# Patient Record
Sex: Female | Born: 1950
Health system: Southern US, Community
[De-identification: ages and names within clinical notes are randomized; demographics above are authoritative.]

## PROBLEM LIST (undated history)

## (undated) DIAGNOSIS — M51369 Other intervertebral disc degeneration, lumbar region without mention of lumbar back pain or lower extremity pain: Secondary | ICD-10-CM

## (undated) DIAGNOSIS — J449 Chronic obstructive pulmonary disease, unspecified: Secondary | ICD-10-CM

## (undated) DIAGNOSIS — I10 Essential (primary) hypertension: Secondary | ICD-10-CM

## (undated) DIAGNOSIS — M109 Gout, unspecified: Secondary | ICD-10-CM

## (undated) DIAGNOSIS — I739 Peripheral vascular disease, unspecified: Secondary | ICD-10-CM

## (undated) DIAGNOSIS — H25019 Cortical age-related cataract, unspecified eye: Secondary | ICD-10-CM

## (undated) DIAGNOSIS — J189 Pneumonia, unspecified organism: Secondary | ICD-10-CM

## (undated) DIAGNOSIS — M5136 Other intervertebral disc degeneration, lumbar region: Secondary | ICD-10-CM

## (undated) DIAGNOSIS — J45909 Unspecified asthma, uncomplicated: Secondary | ICD-10-CM

## (undated) DIAGNOSIS — E785 Hyperlipidemia, unspecified: Secondary | ICD-10-CM

## (undated) DIAGNOSIS — L309 Dermatitis, unspecified: Secondary | ICD-10-CM

## (undated) DIAGNOSIS — M1990 Unspecified osteoarthritis, unspecified site: Secondary | ICD-10-CM

## (undated) HISTORY — DX: Other intervertebral disc degeneration, lumbar region: M51.36

---
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
## 2005-03-19 ENCOUNTER — Emergency Department: Payer: Self-pay | Admitting: Emergency Medicine

## 2011-09-01 ENCOUNTER — Emergency Department: Payer: Self-pay | Admitting: Emergency Medicine

## 2011-12-05 ENCOUNTER — Emergency Department: Payer: Self-pay | Admitting: Emergency Medicine

## 2013-04-30 ENCOUNTER — Ambulatory Visit: Payer: Self-pay | Admitting: Family Medicine

## 2013-09-19 ENCOUNTER — Ambulatory Visit: Payer: Self-pay | Admitting: Physical Medicine and Rehabilitation

## 2014-06-07 ENCOUNTER — Other Ambulatory Visit: Payer: Self-pay | Admitting: Neurosurgery

## 2014-06-07 DIAGNOSIS — M4310 Spondylolisthesis, site unspecified: Secondary | ICD-10-CM

## 2014-06-10 ENCOUNTER — Ambulatory Visit
Admission: RE | Admit: 2014-06-10 | Discharge: 2014-06-10 | Disposition: A | Discharge status: Home / Self Care | Payer: No Typology Code available for payment source | Source: Ambulatory Visit | Attending: Neurosurgery | Admitting: Neurosurgery

## 2014-06-10 VITALS — BP 108/59 | HR 71

## 2014-06-10 DIAGNOSIS — M4310 Spondylolisthesis, site unspecified: Secondary | ICD-10-CM

## 2014-06-10 MED ORDER — ONDANSETRON HCL 4 MG/2ML IJ SOLN
4.0000 mg | Freq: Once | INTRAMUSCULAR | Status: AC
  Administered 2014-06-10: 4 mg via INTRAMUSCULAR

## 2014-06-10 MED ORDER — MEPERIDINE HCL 100 MG/ML IJ SOLN
100.0000 mg | Freq: Once | INTRAMUSCULAR | Status: AC
  Administered 2014-06-10: 100 mg via INTRAMUSCULAR

## 2014-06-10 MED ORDER — DIAZEPAM 5 MG PO TABS
10.0000 mg | ORAL_TABLET | Freq: Once | ORAL | Status: AC
  Administered 2014-06-10: 10 mg via ORAL

## 2014-06-10 MED ORDER — IOHEXOL 180 MG/ML  SOLN: 20.0000 mL | Freq: Once | INTRAMUSCULAR | Status: DC | PRN

## 2014-06-10 NOTE — Progress Notes (Signed)
Patient states she has been off Cymbalta for at least the past two days.  jkl 

## 2014-06-10 NOTE — Discharge Instructions (Signed)
Myelogram Discharge Instructions  1. Go home and rest quietly for the next 24 hours.  It is important to lie flat for the next 24 hours.  Get up only to go to the restroom.  You may lie in the bed or on a couch on your back, your stomach, your left side or your right side.  You may have one pillow under your head.  You may have pillows between your knees while you are on your side or under your knees while you are on your back.  2. DO NOT drive today.  Recline the seat as far back as it will go, while still wearing your seat belt, on the way home.  3. You may get up to go to the bathroom as needed.  You may sit up for 10 minutes to eat.  You may resume your normal diet and medications unless otherwise indicated.  Drink plenty of extra fluids today and tomorrow.  4. The incidence of a spinal headache with nausea and/or vomiting is about 5% (one in 20 patients).  If you develop a headache, lie flat and drink plenty of fluids until the headache goes away.  Caffeinated beverages may be helpful.  If you develop severe nausea and vomiting or a headache that does not go away with flat bed rest, call [phone].  5. You may resume normal activities after your 24 hours of bed rest is over; however, do not exert yourself strongly or do any heavy lifting tomorrow.  6. Call your physician for a follow-up appointment.   You may resume Cymbalta on Friday, June 11, 2014 after 11:00a.m.

## 2014-08-03 DIAGNOSIS — M5416 Radiculopathy, lumbar region: Secondary | ICD-10-CM | POA: Insufficient documentation

## 2014-08-03 DIAGNOSIS — M5136 Other intervertebral disc degeneration, lumbar region: Secondary | ICD-10-CM | POA: Insufficient documentation

## 2014-11-15 DIAGNOSIS — M7542 Impingement syndrome of left shoulder: Secondary | ICD-10-CM | POA: Insufficient documentation

## 2015-01-22 ENCOUNTER — Emergency Department: Payer: Self-pay | Admitting: Emergency Medicine

## 2017-03-19 ENCOUNTER — Ambulatory Visit: Payer: Self-pay | Admitting: Podiatry

## 2017-06-14 ENCOUNTER — Ambulatory Visit (INDEPENDENT_AMBULATORY_CARE_PROVIDER_SITE_OTHER): Payer: Medicaid Other | Admitting: Podiatry

## 2017-06-14 DIAGNOSIS — M79676 Pain in unspecified toe(s): Secondary | ICD-10-CM

## 2017-06-14 DIAGNOSIS — L603 Nail dystrophy: Secondary | ICD-10-CM

## 2017-06-14 DIAGNOSIS — Q828 Other specified congenital malformations of skin: Secondary | ICD-10-CM | POA: Diagnosis not present

## 2017-06-14 DIAGNOSIS — B351 Tinea unguium: Secondary | ICD-10-CM

## 2017-06-14 NOTE — Patient Instructions (Signed)

## 2017-06-14 NOTE — Progress Notes (Signed)
   Subjective:    Patient ID: Sarah CouchMary Buck, female    DOB: September 14, 1951, 66 y.o.   MRN: [id number]  HPI    Review of Systems  HENT: Positive for sinus pain.   Eyes: Positive for itching.  Respiratory: Positive for cough and wheezing.   Musculoskeletal: Positive for back pain and myalgias.  Allergic/Immunologic: Negative.   Neurological: Positive for headaches.  Hematological: Negative.   Psychiatric/Behavioral: Negative.   All other systems reviewed and are negative.      Objective:   Physical Exam        Assessment & Plan:

## 2017-06-14 NOTE — Progress Notes (Signed)
Patient ID: Sarah Buck, female   DOB: July 02, 1951, 66 y.o.   MRN: [id number]   SUBJECTIVE Patient  presents to office today complaining of elongated, thickened nails. Pain while ambulating in shoes. Patient is unable to trim their own nails. Patient also complains that the right great toenails extremely painful due to the thickness and elongation of the nail. She would like to nail totally removed. She states this been ongoing for several years. 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 Patient also complains of symptomatic painful callus lesions on the bilateral forefeet for several years. Patient states that they're extremely painful walk on.  OBJECTIVE General Patient is awake, alert, and oriented x 3 and in no acute distress. Derm Skin is dry and supple bilateral with exception of hyperkeratotic callus lesions noted to the bilateral forefoot 5. Negative open lesions or macerations. Remaining integument unremarkable. Nails are tender, long, thickened and dystrophic with subungual debris, consistent with onychomycosis, 1-5 bilateral. No signs of infection noted. Extremely thickened and discolored toenail noted to the right great toe which is very sensitive and painful to palpation. Vasc  DP and PT pedal pulses palpable bilaterally. Temperature gradient within normal limits.  Neuro Epicritic and protective threshold sensation diminished bilaterally.  Musculoskeletal Exam No symptomatic pedal deformities noted bilateral. Muscular strength within normal limits.  ASSESSMENT 1. Onychodystrophic nails 1-5 bilateral with hyperkeratosis of nails.  2. Painful dystrophic nail right great toe 3. Porokeratosis bilateral feet 5  PLAN OF CARE 1. Patient evaluated today.  2. Instructed to maintain good pedal hygiene and foot care.  3. Mechanical debridement of nails 1-5 bilaterally performed using a nail nipper. Filed with dremel without incident.  4. Today the decision was made to  perform total permanent nail avulsion to the right great toe. All details the procedure and possible complications were explained. Prior to total nail avulsion procedure local anesthesia infiltration was utilized the hallux block fashion right great toe. Total permanent nail avulsion procedure was then performed in an aseptic manner followed by 330 seconds applications of phenol and alcohol flush. 5. Sterile dressings were applied 6. Excisional debridement of the porokeratosis lesions was performed to the bilateral feet 5 using a chisel blade without incident or bleeding.  7. Recommend over-the-counter urea 40% cream 8. Return to clinic in 2 weeks    Felecia ShellingBrent M. Evans, DPM [facility]  Dr. Felecia ShellingBrent M. Evans, DPM    [address]                                        [place], [place] [address]                Office [phone]  Fax [phone]

## 2017-07-09 ENCOUNTER — Ambulatory Visit (INDEPENDENT_AMBULATORY_CARE_PROVIDER_SITE_OTHER): Payer: Medicaid Other | Admitting: Podiatry

## 2017-07-09 DIAGNOSIS — L603 Nail dystrophy: Secondary | ICD-10-CM

## 2017-07-09 DIAGNOSIS — M79676 Pain in unspecified toe(s): Secondary | ICD-10-CM

## 2017-07-09 DIAGNOSIS — B351 Tinea unguium: Secondary | ICD-10-CM | POA: Diagnosis not present

## 2017-07-17 NOTE — Progress Notes (Signed)
   Subjective: Patient presents today 2 weeks post total nail avulsion procedure. Patient states that the toe and nail fold is feeling much better.  No past medical history on file.  Objective: Skin is warm, dry and supple. Nail bed and respective nail fold appears to be healing appropriately. Open wound to the associated nail fold with a granular wound base and moderate amount of fibrotic tissue. Minimal drainage noted. Mild erythema around the periungual region likely due to phenol chemical matricectomy.  Assessment: #1 postop permanent total nail avulsion right great toe #2 open wound periungual nail fold and nail bed of respective digit.   Plan of care: #1 patient was evaluated  #2 debridement of open wound was performed to the periungual border and nail fold of the respective toe using a currette. Antibiotic ointment and Band-Aid was applied. #3 patient is to return to clinic on a PRN  basis.   Felecia ShellingBrent M. Sarae Nicholes, DPM [facility]  Dr. Felecia ShellingBrent M. Denver Bentson, DPM    [address]                                        [place], [place] [address]                Office [phone](336) [phone]  Fax [phone]

## 2017-10-11 ENCOUNTER — Ambulatory Visit: Payer: Medicaid Other | Admitting: Podiatry

## 2017-10-11 ENCOUNTER — Ambulatory Visit (INDEPENDENT_AMBULATORY_CARE_PROVIDER_SITE_OTHER): Payer: Medicaid Other | Admitting: Podiatry

## 2017-10-11 DIAGNOSIS — L989 Disorder of the skin and subcutaneous tissue, unspecified: Secondary | ICD-10-CM

## 2017-10-11 DIAGNOSIS — M79676 Pain in unspecified toe(s): Secondary | ICD-10-CM | POA: Diagnosis not present

## 2017-10-11 DIAGNOSIS — B351 Tinea unguium: Secondary | ICD-10-CM | POA: Diagnosis not present

## 2017-10-14 NOTE — Progress Notes (Signed)
    Subjective: Patient is a 66 y.o. female presenting to the office today with a chief complaint of painful callus lesions to the bilateral feet that have been present for several weels.  Patient also complains of elongated, thickened nails that cause pain while ambulating in shoes. Patient is unable to trim their own nails. Patient presents today for further treatment and evaluation.  No past medical history on file.  Objective:  Physical Exam General: Alert and oriented x3 in no acute distress  Dermatology: Hyperkeratotic lesion present on the bilateral feet x 5. Pain on palpation with a central nucleated core noted. Skin is warm, dry and supple bilateral lower extremities. Negative for open lesions or macerations. Nails are tender, long, thickened and dystrophic with subungual debris, consistent with onychomycosis, 1-5 bilateral. No signs of infection noted.  Vascular: Palpable pedal pulses bilaterally. No edema or erythema noted. Capillary refill within normal limits.  Neurological: Epicritic and protective threshold grossly intact bilaterally.   Musculoskeletal Exam: Pain on palpation at the keratotic lesion noted. Range of motion within normal limits bilateral. Muscle strength 5/5 in all groups bilateral.  Assessment: 1. Onychodystrophic nails 1-5 bilateral with hyperkeratosis of nails.  2. Onychomycosis of nail due to dermatophyte bilateral 3. Pre-ulcerative calluses to the bilateral feet x 5   Plan of Care:  #1 Patient evaluated. #2 Excisional debridement of keratoic lesion using a chisel blade was performed without incident.  #3 Dressed with light dressing. #4 Mechanical debridement of nails 1-5 bilaterally performed using a nail nipper. Filed with dremel without incident.  #5 Patient is to return to the clinic in 3 months.   Felecia ShellingBrent M. Burl Tauzin, DPM [facility]  Dr. Felecia ShellingBrent M. Caral Whan, DPM    [address]                                          [place], [place] [address]                Office [phone]) [phone]  Fax [phone](336) [phone]

## 2018-01-10 ENCOUNTER — Ambulatory Visit: Payer: Medicaid Other | Admitting: Podiatry

## 2018-01-10 DIAGNOSIS — B351 Tinea unguium: Secondary | ICD-10-CM

## 2018-01-10 DIAGNOSIS — L989 Disorder of the skin and subcutaneous tissue, unspecified: Secondary | ICD-10-CM | POA: Diagnosis not present

## 2018-01-10 DIAGNOSIS — M79676 Pain in unspecified toe(s): Secondary | ICD-10-CM | POA: Diagnosis not present

## 2018-01-13 NOTE — Progress Notes (Signed)
    Subjective: Patient is a 67 y.o. female presenting to the office today with a chief complaint of painful callus lesions to the bilateral feet that have been present for several weels. She also complains of elongated, thickened nails that cause pain while ambulating in shoes. She is unable to trim her own nails. Patient presents today for further treatment and evaluation.   No past medical history on file.  Objective:  Physical Exam General: Alert and oriented x3 in no acute distress  Dermatology: Hyperkeratotic lesion present on the bilateral feet x 5. Pain on palpation with a central nucleated core noted. Skin is warm, dry and supple bilateral lower extremities. Negative for open lesions or macerations. Nails are tender, long, thickened and dystrophic with subungual debris, consistent with onychomycosis, 1-5 bilateral. No signs of infection noted.  Vascular: Palpable pedal pulses bilaterally. No edema or erythema noted. Capillary refill within normal limits.  Neurological: Epicritic and protective threshold grossly intact bilaterally.   Musculoskeletal Exam: Pain on palpation at the keratotic lesion noted. Range of motion within normal limits bilateral. Muscle strength 5/5 in all groups bilateral.  Assessment: 1. Onychodystrophic nails 1-5 bilateral with hyperkeratosis of nails.  2. Onychomycosis of nail due to dermatophyte bilateral 3. Pre-ulcerative calluses to the bilateral feet x 5   Plan of Care:  #1 Patient evaluated. #2 Excisional debridement of keratoic lesion using a chisel blade was performed without incident.  #3 Dressed with light dressing. #4 Mechanical debridement of nails 1-5 bilaterally performed using a nail nipper. Filed with dremel without incident.  #5 Patient is to return to the clinic in 3 months.   Felecia ShellingBrent M. Kymora Sciara, DPM [facility]  Dr. Felecia ShellingBrent M. Kelsye Loomer, DPM    [address]                                        [place], [place] [address]                 Office [phone]  Fax [phone]

## 2018-04-18 ENCOUNTER — Ambulatory Visit: Payer: Medicaid Other | Admitting: Podiatry

## 2018-04-18 DIAGNOSIS — B351 Tinea unguium: Secondary | ICD-10-CM

## 2018-04-18 DIAGNOSIS — M79676 Pain in unspecified toe(s): Secondary | ICD-10-CM

## 2018-04-18 DIAGNOSIS — L989 Disorder of the skin and subcutaneous tissue, unspecified: Secondary | ICD-10-CM

## 2018-04-21 NOTE — Progress Notes (Signed)
    Subjective: Patient is a 67 y.o. female presenting to the office today with a chief complaint of painful callus lesions to the bilateral feet that have been present for several weels. Bearing weight increases the pain. She has not done anything for treatment.  She also complains of elongated, thickened nails that cause pain while ambulating in shoes. She is unable to trim her own nails. Patient presents today for further treatment and evaluation.   No past medical history on file.  Objective:  Physical Exam General: Alert and oriented x3 in no acute distress  Dermatology: Hyperkeratotic lesion present on the bilateral feet x 5. Pain on palpation with a central nucleated core noted. Skin is warm, dry and supple bilateral lower extremities. Negative for open lesions or macerations. Nails are tender, long, thickened and dystrophic with subungual debris, consistent with onychomycosis, 1-5 bilateral. No signs of infection noted.  Vascular: Palpable pedal pulses bilaterally. No edema or erythema noted. Capillary refill within normal limits.  Neurological: Epicritic and protective threshold grossly intact bilaterally.   Musculoskeletal Exam: Pain on palpation at the keratotic lesion noted. Range of motion within normal limits bilateral. Muscle strength 5/5 in all groups bilateral.  Assessment: 1. Onychodystrophic nails 1-5 bilateral with hyperkeratosis of nails.  2. Onychomycosis of nail due to dermatophyte bilateral 3. Pre-ulcerative calluses to the bilateral feet x 5   Plan of Care:  #1 Patient evaluated. #2 Excisional debridement of keratoic lesion using a chisel blade was performed without incident.  #3 Dressed with light dressing. #4 Mechanical debridement of nails 1-5 bilaterally performed using a nail nipper. Filed with dremel without incident.  #5 Patient is to return to the clinic in 3 months.   Felecia ShellingBrent M. Aarush Stukey, DPM [facility]  Dr. Felecia ShellingBrent M. Delisha Peaden, DPM      [address]                                        [place], [place] [address]                Office [phone]  Fax [phone]

## 2018-06-04 ENCOUNTER — Emergency Department
Admission: EM | Admit: 2018-06-04 | Discharge: 2018-06-04 | Disposition: A | Discharge status: Home / Self Care | Payer: Medicaid Other | Attending: Emergency Medicine | Admitting: Emergency Medicine

## 2018-06-04 ENCOUNTER — Other Ambulatory Visit: Payer: Self-pay

## 2018-06-04 DIAGNOSIS — Y9389 Activity, other specified: Secondary | ICD-10-CM | POA: Diagnosis not present

## 2018-06-04 DIAGNOSIS — T162XXA Foreign body in left ear, initial encounter: Secondary | ICD-10-CM | POA: Insufficient documentation

## 2018-06-04 DIAGNOSIS — I10 Essential (primary) hypertension: Secondary | ICD-10-CM | POA: Insufficient documentation

## 2018-06-04 DIAGNOSIS — Y998 Other external cause status: Secondary | ICD-10-CM | POA: Diagnosis not present

## 2018-06-04 DIAGNOSIS — Z79899 Other long term (current) drug therapy: Secondary | ICD-10-CM | POA: Insufficient documentation

## 2018-06-04 DIAGNOSIS — X58XXXA Exposure to other specified factors, initial encounter: Secondary | ICD-10-CM | POA: Insufficient documentation

## 2018-06-04 DIAGNOSIS — Y929 Unspecified place or not applicable: Secondary | ICD-10-CM | POA: Insufficient documentation

## 2018-06-04 HISTORY — DX: Essential (primary) hypertension: I10

## 2018-06-04 HISTORY — DX: Unspecified osteoarthritis, unspecified site: M19.90

## 2018-06-04 MED ORDER — NEOMYCIN-POLYMYXIN-HC 3.5-10000-1 OT SOLN: 3.0000 [drp] | Freq: Three times a day (TID) | OTIC | 0 refills | Status: AC

## 2018-06-04 NOTE — ED Provider Notes (Signed)
[facility] Emergency Department Provider Note   ____________________________________________   First MD Initiated Contact with Patient 06/04/18 1400     (approximate)  I have reviewed the triage vital signs and the nursing notes.   HISTORY  Chief Complaint Foreign Body in Ear    HPI Sarah Buck is a 67 y.o. female patient presents with foreign body ear for 3 days.  Patient went to her PCP yesterday and after many attempts they were unable to remove the insect from the ear.  Today patient was given a stat consult to ENT but mistakenly came to the emergency room.  Past Medical History:  Diagnosis Date  . Arthritis   . Hypertension     Patient Active Problem List   Diagnosis Date Noted  . Impingement syndrome of left shoulder 11/15/2014  . DDD (degenerative disc disease), lumbar 08/03/2014  . Lumbar radiculitis 08/03/2014    History reviewed. No pertinent surgical history.  Prior to Admission medications   Medication Sig Start Date End Date Taking? Authorizing Provider  DULoxetine (CYMBALTA) 60 MG capsule Take by mouth. 04/02/14   [provider]  fluticasone (FLONASE) 50 MCG/ACT nasal spray Place into the nose. 11/09/16   [provider]  gabapentin (NEURONTIN) 300 MG capsule 1 po qAM, 1 qLunch, and 2 qHS 06/04/17   [provider]  hydrochlorothiazide (HYDRODIURIL) 25 MG tablet Take by mouth. 12/17/16   [provider]  HYDROcodone-acetaminophen (NORCO) 7.5-325 MG per tablet 1/2-1 po bid prn 05/19/14   [provider]  Ipratropium-Albuterol (COMBIVENT) 20-100 MCG/ACT AERS respimat Inhale into the lungs. 12/17/16   [provider]  meclizine (ANTIVERT) 25 MG tablet Take by mouth.    [provider]  meloxicam (MOBIC) 15 MG tablet Take by mouth. 06/14/17   [provider]  nabumetone (RELAFEN) 500 MG tablet 1 po bid prn 06/04/17   [provider]    neomycin-polymyxin-hydrocortisone (CORTISPORIN) OTIC solution Place 3 drops into the left ear 3 (three) times daily for 10 days. 06/04/18 06/14/18  Joni ReiningSmith, Kordell Jafri K, PA-C  triamcinolone (NASACORT) 55 MCG/ACT AERO nasal inhaler Place into the nose. 04/14/18 04/14/19  [provider]    Allergies Patient has no known allergies.  No family history on file.  Social History Social History   Tobacco Use  . Smoking status: Never Smoker  . Smokeless tobacco: Never Used  Substance Use Topics  . Alcohol use: Never    Frequency: Never  . Drug use: Never    Review of Systems  Constitutional: No fever/chills Eyes: No visual changes. ENT: No sore throat.  Foreign body left ear Cardiovascular: Denies chest pain. Respiratory: Denies shortness of breath. Gastrointestinal: No abdominal pain.  No nausea, no vomiting.  No diarrhea.  No constipation. Genitourinary: Negative for dysuria. Musculoskeletal: Negative for back pain. Skin: Negative for rash. Neurological: Negative for headaches, focal weakness or numbness.   ____________________________________________   PHYSICAL EXAM:  VITAL SIGNS: ED Triage Vitals  Enc Vitals Group     BP 06/04/18 1354 132/88     Pulse Rate 06/04/18 1354 64     Resp 06/04/18 1354 16     Temp 06/04/18 1354 98.5 F (36.9 C)     Temp Source 06/04/18 1354 Oral     SpO2 06/04/18 1354 99 %     Weight 06/04/18 1355 183 lb (83 kg)     Height 06/04/18 1355 5\' 5"  (1.651 m)     Head Circumference --  Peak Flow --      Pain Score 06/04/18 1355 2     Pain Loc --      Pain Edu? --      Excl. in GC? --    Constitutional: Alert and oriented. Well appearing and in no acute distress. EARS: Visible insect in left ear. Cardiovascular: Normal rate, regular rhythm. Grossly normal heart sounds.  Good peripheral circulation. Respiratory: Normal respiratory effort.  No retractions. Lungs CTAB. Skin:  Skin is warm, dry and intact. No rash noted. Psychiatric: Mood  and affect are normal. Speech and behavior are normal.  ____________________________________________   LABS (all labs ordered are listed, but only abnormal results are displayed)  Labs Reviewed - No data to display ____________________________________________  EKG   ____________________________________________  RADIOLOGY  ED MD interpretation:    Official radiology report(s): No results found.  ____________________________________________   PROCEDURES  Procedure(s) performed: None  Procedures  Critical Care performed: No  ____________________________________________   INITIAL IMPRESSION / ASSESSMENT AND PLAN / ED COURSE  As part of my medical decision making, I reviewed the following data within the electronic MEDICAL RECORD NUMBER    Left ear discomfort secondary to foreign body.  Discussed patient with Dr. Genevive Bi who was seen the patient in his clinic tomorrow at 4 PM.  Patient advised use eardrops as directed.      ____________________________________________   FINAL CLINICAL IMPRESSION(S) / ED DIAGNOSES  Final diagnoses:  Foreign body of left ear, initial encounter     ED Discharge Orders        Ordered    neomycin-polymyxin-hydrocortisone (CORTISPORIN) OTIC solution  3 times daily     06/04/18 1438       Note:  This document was prepared using Dragon voice recognition software and may include unintentional dictation errors.    Joni Reining, PA-C 06/04/18 1444    Minna Antis, MD 06/04/18 1530

## 2018-06-04 NOTE — Discharge Instructions (Signed)
Pick up and drop prescription from your pharmacy.  Follow-up with schedule appointment tomorrow at 4 PM

## 2018-06-04 NOTE — ED Triage Notes (Signed)
Pt states she has had a bug in her left ear since Monday and went to [facility] yesterday and was not able to get it out of the ear. States they sent her home to rest because her ear was so irritated from trying to get it out.

## 2018-07-22 ENCOUNTER — Ambulatory Visit: Payer: Medicaid Other | Admitting: Podiatry

## 2018-08-15 ENCOUNTER — Ambulatory Visit: Payer: Medicaid Other | Admitting: Podiatry

## 2018-08-29 ENCOUNTER — Ambulatory Visit (INDEPENDENT_AMBULATORY_CARE_PROVIDER_SITE_OTHER): Payer: Medicare Other | Admitting: Podiatry

## 2018-08-29 ENCOUNTER — Encounter: Payer: Self-pay | Admitting: Podiatry

## 2018-08-29 DIAGNOSIS — B351 Tinea unguium: Secondary | ICD-10-CM

## 2018-08-29 DIAGNOSIS — M79676 Pain in unspecified toe(s): Secondary | ICD-10-CM | POA: Diagnosis not present

## 2018-08-29 DIAGNOSIS — L989 Disorder of the skin and subcutaneous tissue, unspecified: Secondary | ICD-10-CM

## 2018-08-29 NOTE — Progress Notes (Signed)
    Subjective: Patient is a 67 y.o. female presenting to the office today for follow up evaluation of painful callus lesions to the bilateral feet. Bearing weight increases the pain. She has not done anything for treatment at home.  She also complains of elongated, thickened nails that cause pain while ambulating in shoes. She is unable to trim her own nails. Patient presents today for further treatment and evaluation.   Past Medical History:  Diagnosis Date  . Arthritis   . Hypertension     Objective:  Physical Exam General: Alert and oriented x3 in no acute distress  Dermatology: Hyperkeratotic lesion present on the bilateral feet x 5. Pain on palpation with a central nucleated core noted. Skin is warm, dry and supple bilateral lower extremities. Negative for open lesions or macerations. Nails are tender, long, thickened and dystrophic with subungual debris, consistent with onychomycosis, 1-5 bilateral. No signs of infection noted.  Vascular: Palpable pedal pulses bilaterally. No edema or erythema noted. Capillary refill within normal limits.  Neurological: Epicritic and protective threshold grossly intact bilaterally.   Musculoskeletal Exam: Pain on palpation at the keratotic lesion noted. Range of motion within normal limits bilateral. Muscle strength 5/5 in all groups bilateral.  Assessment: 1. Onychodystrophic nails 1-5 bilateral with hyperkeratosis of nails.  2. Onychomycosis of nail due to dermatophyte bilateral 3. Pre-ulcerative calluses to the bilateral feet x 5   Plan of Care:  #1 Patient evaluated. #2 Excisional debridement of keratoic lesion using a chisel blade was performed without incident.  #3 Dressed with light dressing. #4 Mechanical debridement of nails 1-5 bilaterally performed using a nail nipper. Filed with dremel without incident.  #5 Patient is to return to the clinic in 3 months.   Felecia Shelling, DPM [facility]  Dr. Felecia Shelling,  DPM    [address]                                        [place], [place] [address]                Office [phone]  Fax [phone]

## 2018-09-16 DIAGNOSIS — M1611 Unilateral primary osteoarthritis, right hip: Secondary | ICD-10-CM | POA: Diagnosis not present

## 2018-09-24 DIAGNOSIS — E113393 Type 2 diabetes mellitus with moderate nonproliferative diabetic retinopathy without macular edema, bilateral: Secondary | ICD-10-CM | POA: Diagnosis not present

## 2018-11-28 ENCOUNTER — Ambulatory Visit: Payer: Medicare Other | Admitting: Podiatry

## 2018-12-16 ENCOUNTER — Ambulatory Visit: Payer: Medicare HMO | Admitting: Podiatry

## 2019-02-11 DIAGNOSIS — J302 Other seasonal allergic rhinitis: Secondary | ICD-10-CM | POA: Diagnosis not present

## 2019-03-25 DIAGNOSIS — E113393 Type 2 diabetes mellitus with moderate nonproliferative diabetic retinopathy without macular edema, bilateral: Secondary | ICD-10-CM | POA: Diagnosis not present

## 2019-04-13 DIAGNOSIS — I10 Essential (primary) hypertension: Secondary | ICD-10-CM | POA: Diagnosis not present

## 2019-04-13 DIAGNOSIS — E785 Hyperlipidemia, unspecified: Secondary | ICD-10-CM | POA: Diagnosis not present

## 2019-04-15 DIAGNOSIS — M25552 Pain in left hip: Secondary | ICD-10-CM | POA: Diagnosis not present

## 2019-04-15 DIAGNOSIS — J449 Chronic obstructive pulmonary disease, unspecified: Secondary | ICD-10-CM | POA: Diagnosis not present

## 2019-04-15 DIAGNOSIS — I10 Essential (primary) hypertension: Secondary | ICD-10-CM | POA: Diagnosis not present

## 2019-04-15 DIAGNOSIS — D649 Anemia, unspecified: Secondary | ICD-10-CM | POA: Diagnosis not present

## 2019-04-15 DIAGNOSIS — J309 Allergic rhinitis, unspecified: Secondary | ICD-10-CM | POA: Diagnosis not present

## 2019-04-15 DIAGNOSIS — M25551 Pain in right hip: Secondary | ICD-10-CM | POA: Diagnosis not present

## 2019-04-21 DIAGNOSIS — M1612 Unilateral primary osteoarthritis, left hip: Secondary | ICD-10-CM | POA: Diagnosis not present

## 2019-05-24 ENCOUNTER — Encounter: Payer: Self-pay | Admitting: Emergency Medicine

## 2019-05-24 ENCOUNTER — Emergency Department
Admission: EM | Admit: 2019-05-24 | Discharge: 2019-05-24 | Disposition: A | Discharge status: Home / Self Care | Payer: Medicare HMO | Attending: Emergency Medicine | Admitting: Emergency Medicine

## 2019-05-24 ENCOUNTER — Other Ambulatory Visit: Payer: Self-pay

## 2019-05-24 DIAGNOSIS — I10 Essential (primary) hypertension: Secondary | ICD-10-CM | POA: Diagnosis not present

## 2019-05-24 DIAGNOSIS — M5441 Lumbago with sciatica, right side: Secondary | ICD-10-CM | POA: Diagnosis not present

## 2019-05-24 DIAGNOSIS — M5442 Lumbago with sciatica, left side: Secondary | ICD-10-CM | POA: Diagnosis not present

## 2019-05-24 DIAGNOSIS — M545 Low back pain: Secondary | ICD-10-CM | POA: Diagnosis present

## 2019-05-24 DIAGNOSIS — Z79899 Other long term (current) drug therapy: Secondary | ICD-10-CM | POA: Diagnosis not present

## 2019-05-24 DIAGNOSIS — M5440 Lumbago with sciatica, unspecified side: Secondary | ICD-10-CM | POA: Diagnosis not present

## 2019-05-24 MED ORDER — NAPROXEN 500 MG PO TABS
500.0000 mg | ORAL_TABLET | Freq: Once | ORAL | Status: AC
  Administered 2019-05-24: 09:00:00 500 mg via ORAL
  Filled 2019-05-24: qty 1

## 2019-05-24 MED ORDER — LIDOCAINE 5 % EX PTCH
1.0000 | MEDICATED_PATCH | CUTANEOUS | Status: DC
  Administered 2019-05-24: 09:00:00 1 via TRANSDERMAL
  Filled 2019-05-24: qty 1

## 2019-05-24 NOTE — ED Provider Notes (Signed)
[facility] Emergency Department Provider Note   ____________________________________________   First MD Initiated Contact with Patient 05/24/19 0830     (approximate)  I have reviewed the triage vital signs and the nursing notes.   HISTORY  Chief Complaint Back Pain    HPI Sarah Buck is a 68 y.o. female patient presents with low back and bilateral hip pain for several days.  Patient has history of arthritis and is scheduled her doctor tomorrow.  Patient pain is worse today she cannot wait until tomorrow for pain relief.  Patient describes radicular pain to the bilateral lower extremity.  Patient denies bladder bowel dysfunction.  Patient rates the pain as a 10/10.  Patient scribed pain is "achy".  No palliative measures for complaint.  Patient arrived via POV.         Past Medical History:  Diagnosis Date  . Arthritis   . Hypertension     Patient Active Problem List   Diagnosis Date Noted  . Impingement syndrome of left shoulder 11/15/2014  . DDD (degenerative disc disease), lumbar 08/03/2014  . Lumbar radiculitis 08/03/2014    History reviewed. No pertinent surgical history.  Prior to Admission medications   Medication Sig Start Date End Date Taking? Authorizing Provider  DULoxetine (CYMBALTA) 60 MG capsule Take by mouth. 04/02/14   [provider]  fluticasone (FLONASE) 50 MCG/ACT nasal spray Place into the nose. 11/09/16   [provider]  gabapentin (NEURONTIN) 300 MG capsule 1 po qAM, 1 qLunch, and 2 qHS 06/04/17   [provider]  hydrochlorothiazide (HYDRODIURIL) 25 MG tablet Take by mouth. 12/17/16   [provider]  HYDROcodone-acetaminophen ([place]) 7.5-325 MG per tablet 1/2-1 po bid prn 05/19/14   [provider]  Ipratropium-Albuterol (COMBIVENT) 20-100 MCG/ACT AERS respimat Inhale into the lungs. 12/17/16   [provider]  meclizine (ANTIVERT) 25 MG tablet Take by mouth.     [provider]  meloxicam (MOBIC) 15 MG tablet Take by mouth. 06/14/17   [provider]  nabumetone (RELAFEN) 500 MG tablet 1 po bid prn 06/04/17   [provider]  potassium chloride (K-DUR,KLOR-CON) 10 MEQ tablet Take by mouth. 04/29/18 04/29/19  [provider]  triamcinolone (NASACORT) 55 MCG/ACT AERO nasal inhaler Place into the nose. 04/14/18 04/14/19  [provider]    Allergies Patient has no known allergies.  No family history on file.  Social History Social History   Tobacco Use  . Smoking status: Never Smoker  . Smokeless tobacco: Never Used  Substance Use Topics  . Alcohol use: Never    Frequency: Never  . Drug use: Never    Review of Systems Constitutional: No fever/chills Eyes: No visual changes. ENT: No sore throat. Cardiovascular: Denies chest pain. Respiratory: Denies shortness of breath. Gastrointestinal: No abdominal pain.  No nausea, no vomiting.  No diarrhea.  No constipation. Genitourinary: Negative for dysuria. Musculoskeletal: Back and bilateral hip pain. Skin: Negative for rash. Neurological: Negative for headaches, focal weakness or numbness. Endocrine:  Hypertension. ____________________________________________   PHYSICAL EXAM:  VITAL SIGNS: ED Triage Vitals [05/24/19 0826]  Enc Vitals Group     BP 124/72     Pulse Rate 70     Resp 18     Temp 99.8 F (37.7 C)     Temp Source Oral     SpO2 94 %     Weight 195 lb (88.5 kg)     Height 5\' 6"  (1.676 m)  Head Circumference      Peak Flow      Pain Score 10     Pain Loc      Pain Edu?      Excl. in GC?    Constitutional: Alert and oriented. Well appearing and in no acute distress. Hematological/Lymphatic/Immunilogical: No cervical lymphadenopathy. Cardiovascular: Normal rate, regular rhythm. Grossly normal heart sounds.  Good peripheral circulation. Respiratory: Normal respiratory effort.  No retractions. Lungs CTAB. Gastrointestinal: Soft  and nontender. No distention. No abdominal bruits. No CVA tenderness. Musculoskeletal: No obvious spinal deformity.  Patient states this time her reliance on upper extremities.  Patient has moderate guarding palpation of L3-S1.  No lower extremity tenderness nor edema.  Patient had bilateral negative straight leg test.  No joint effusions. Neurologic:  Normal speech and language. No gross focal neurologic deficits are appreciated. No gait instability. Skin:  Skin is warm, dry and intact. No rash noted. Psychiatric: Mood and affect are normal. Speech and behavior are normal.  ____________________________________________   LABS (all labs ordered are listed, but only abnormal results are displayed)  Labs Reviewed - No data to display ____________________________________________  EKG   ____________________________________________  RADIOLOGY  ED MD interpretation:    Official radiology report(s): No results found.  ____________________________________________   PROCEDURES  Procedure(s) performed (including Critical Care):  Procedures   ____________________________________________   INITIAL IMPRESSION / ASSESSMENT AND PLAN / ED COURSE  As part of my medical decision making, I reviewed the following data within the electronic MEDICAL RECORD NUMBER         Gerre CouchMary Goedken was evaluated in Emergency Department on 05/24/2019 for the symptoms described in the history of present illness. She was evaluated in the context of the global COVID-19 pandemic, which necessitated consideration that the patient might be at risk for infection with the SARS-CoV-2 virus that causes COVID-19. Institutional protocols and algorithms that pertain to the evaluation of patients at risk for COVID-19 are in a state of rapid change based on information released by regulatory bodies including the CDC and federal and state organizations. These policies and algorithms were followed during the patient's care in the  ED.   Patient request pain relief for back and bilateral hip pain secondary to arthritis.  Patient will follow-up with scheduled PCP appointment tomorrow.  Patient given discharge care instruction.  A Lidoderm patch was applied prior to departure.      ____________________________________________   FINAL CLINICAL IMPRESSION(S) / ED DIAGNOSES  Final diagnoses:  Bilateral low back pain with sciatica, sciatica laterality unspecified, unspecified chronicity     ED Discharge Orders    None       Note:  This document was prepared using Dragon voice recognition software and may include unintentional dictation errors.    Joni ReiningSmith, Ronald K, PA-C 05/24/19 [id number]    Sharyn CreamerQuale, Mark, MD 05/24/19 [phone]

## 2019-05-24 NOTE — ED Triage Notes (Signed)
Pt c/o arthritic back pain and hip pain for several days, states she is due to see her PCP tomorrow but states the pain is worse.  Pt is ambulatory without difficulty.

## 2019-05-24 NOTE — Discharge Instructions (Signed)
Follow-up with scheduled doctor's appointment tomorrow.

## 2019-05-25 ENCOUNTER — Other Ambulatory Visit: Payer: Self-pay | Admitting: Physical Medicine and Rehabilitation

## 2019-05-25 DIAGNOSIS — M47816 Spondylosis without myelopathy or radiculopathy, lumbar region: Secondary | ICD-10-CM | POA: Diagnosis not present

## 2019-05-25 DIAGNOSIS — M1611 Unilateral primary osteoarthritis, right hip: Secondary | ICD-10-CM | POA: Diagnosis not present

## 2019-05-25 DIAGNOSIS — M48062 Spinal stenosis, lumbar region with neurogenic claudication: Secondary | ICD-10-CM | POA: Diagnosis not present

## 2019-05-25 DIAGNOSIS — M5416 Radiculopathy, lumbar region: Secondary | ICD-10-CM

## 2019-05-25 DIAGNOSIS — M5441 Lumbago with sciatica, right side: Secondary | ICD-10-CM | POA: Diagnosis not present

## 2019-05-25 DIAGNOSIS — M5442 Lumbago with sciatica, left side: Secondary | ICD-10-CM | POA: Diagnosis not present

## 2019-05-25 DIAGNOSIS — M5136 Other intervertebral disc degeneration, lumbar region: Secondary | ICD-10-CM | POA: Diagnosis not present

## 2019-06-04 ENCOUNTER — Ambulatory Visit
Admission: RE | Admit: 2019-06-04 | Discharge: 2019-06-04 | Disposition: A | Discharge status: Home / Self Care | Payer: Medicare HMO | Source: Ambulatory Visit | Attending: Physical Medicine and Rehabilitation | Admitting: Physical Medicine and Rehabilitation

## 2019-06-04 ENCOUNTER — Other Ambulatory Visit: Payer: Self-pay

## 2019-06-04 DIAGNOSIS — M4316 Spondylolisthesis, lumbar region: Secondary | ICD-10-CM | POA: Diagnosis not present

## 2019-06-04 DIAGNOSIS — M5416 Radiculopathy, lumbar region: Secondary | ICD-10-CM

## 2019-06-04 DIAGNOSIS — M48061 Spinal stenosis, lumbar region without neurogenic claudication: Secondary | ICD-10-CM | POA: Diagnosis not present

## 2019-06-22 DIAGNOSIS — M48062 Spinal stenosis, lumbar region with neurogenic claudication: Secondary | ICD-10-CM | POA: Diagnosis not present

## 2019-06-22 DIAGNOSIS — M1611 Unilateral primary osteoarthritis, right hip: Secondary | ICD-10-CM | POA: Diagnosis not present

## 2019-06-22 DIAGNOSIS — M5136 Other intervertebral disc degeneration, lumbar region: Secondary | ICD-10-CM | POA: Diagnosis not present

## 2019-06-22 DIAGNOSIS — M5416 Radiculopathy, lumbar region: Secondary | ICD-10-CM | POA: Diagnosis not present

## 2019-06-30 DIAGNOSIS — M1611 Unilateral primary osteoarthritis, right hip: Secondary | ICD-10-CM | POA: Diagnosis not present

## 2019-07-07 ENCOUNTER — Encounter: Payer: Self-pay | Admitting: Podiatry

## 2019-07-07 ENCOUNTER — Ambulatory Visit (INDEPENDENT_AMBULATORY_CARE_PROVIDER_SITE_OTHER): Payer: Medicare HMO | Admitting: Podiatry

## 2019-07-07 ENCOUNTER — Other Ambulatory Visit: Payer: Self-pay

## 2019-07-07 VITALS — Temp 98.0°F

## 2019-07-07 DIAGNOSIS — M79676 Pain in unspecified toe(s): Secondary | ICD-10-CM | POA: Diagnosis not present

## 2019-07-07 DIAGNOSIS — L989 Disorder of the skin and subcutaneous tissue, unspecified: Secondary | ICD-10-CM

## 2019-07-07 DIAGNOSIS — B351 Tinea unguium: Secondary | ICD-10-CM

## 2019-07-09 NOTE — Progress Notes (Signed)
    Subjective: Patient is a 68 y.o. female presenting to the office today for follow up evaluation of painful callus lesions to the bilateral feet. Bearing weight increases the pain. She has not done anything for treatment at home.  She also complains of elongated, thickened nails that cause pain while ambulating in shoes. She is unable to trim her own nails. Patient presents today for further treatment and evaluation.   Past Medical History:  Diagnosis Date  . Arthritis   . Hypertension     Objective:  Physical Exam General: Alert and oriented x3 in no acute distress  Dermatology: Hyperkeratotic lesion present on the bilateral feet x 5. Pain on palpation with a central nucleated core noted. Skin is warm, dry and supple bilateral lower extremities. Negative for open lesions or macerations. Nails are tender, long, thickened and dystrophic with subungual debris, consistent with onychomycosis, 1-5 bilateral. No signs of infection noted.  Vascular: Palpable pedal pulses bilaterally. No edema or erythema noted. Capillary refill within normal limits.  Neurological: Epicritic and protective threshold grossly intact bilaterally.   Musculoskeletal Exam: Pain on palpation at the keratotic lesion noted. Range of motion within normal limits bilateral. Muscle strength 5/5 in all groups bilateral.  Assessment: 1. Onychodystrophic nails 1-5 bilateral with hyperkeratosis of nails.  2. Onychomycosis of nail due to dermatophyte bilateral 3. Pre-ulcerative calluses to the bilateral feet x 5   Plan of Care:  #1 Patient evaluated. #2 Excisional debridement of keratoic lesion using a chisel blade was performed without incident.  #3 Dressed with light dressing. #4 Mechanical debridement of nails 1-5 bilaterally performed using a nail nipper. Filed with dremel without incident.  #5 Patient is to return to the clinic in 3 months.   Edrick Kins, DPM [facility]  Dr. Edrick Kins,  [address]                                        [place], [place] [address]                Office [phone]  Fax [phone]

## 2019-08-03 DIAGNOSIS — M5416 Radiculopathy, lumbar region: Secondary | ICD-10-CM | POA: Diagnosis not present

## 2019-08-03 DIAGNOSIS — M48062 Spinal stenosis, lumbar region with neurogenic claudication: Secondary | ICD-10-CM | POA: Diagnosis not present

## 2019-08-03 DIAGNOSIS — J449 Chronic obstructive pulmonary disease, unspecified: Secondary | ICD-10-CM | POA: Diagnosis not present

## 2019-08-03 DIAGNOSIS — M5136 Other intervertebral disc degeneration, lumbar region: Secondary | ICD-10-CM | POA: Diagnosis not present

## 2019-08-03 DIAGNOSIS — M1611 Unilateral primary osteoarthritis, right hip: Secondary | ICD-10-CM | POA: Diagnosis not present

## 2019-09-28 DIAGNOSIS — E113393 Type 2 diabetes mellitus with moderate nonproliferative diabetic retinopathy without macular edema, bilateral: Secondary | ICD-10-CM | POA: Diagnosis not present

## 2019-09-28 DIAGNOSIS — J449 Chronic obstructive pulmonary disease, unspecified: Secondary | ICD-10-CM | POA: Diagnosis not present

## 2019-09-28 DIAGNOSIS — H524 Presbyopia: Secondary | ICD-10-CM | POA: Diagnosis not present

## 2019-12-07 DIAGNOSIS — M48062 Spinal stenosis, lumbar region with neurogenic claudication: Secondary | ICD-10-CM | POA: Diagnosis not present

## 2019-12-07 DIAGNOSIS — M5136 Other intervertebral disc degeneration, lumbar region: Secondary | ICD-10-CM | POA: Diagnosis not present

## 2019-12-07 DIAGNOSIS — M5416 Radiculopathy, lumbar region: Secondary | ICD-10-CM | POA: Diagnosis not present

## 2019-12-07 DIAGNOSIS — M1611 Unilateral primary osteoarthritis, right hip: Secondary | ICD-10-CM | POA: Diagnosis not present

## 2019-12-22 DIAGNOSIS — M1611 Unilateral primary osteoarthritis, right hip: Secondary | ICD-10-CM | POA: Diagnosis not present

## 2020-02-05 ENCOUNTER — Ambulatory Visit (INDEPENDENT_AMBULATORY_CARE_PROVIDER_SITE_OTHER): Payer: Medicare HMO | Admitting: Podiatry

## 2020-02-05 ENCOUNTER — Other Ambulatory Visit: Payer: Self-pay

## 2020-02-05 DIAGNOSIS — B351 Tinea unguium: Secondary | ICD-10-CM

## 2020-02-05 DIAGNOSIS — L989 Disorder of the skin and subcutaneous tissue, unspecified: Secondary | ICD-10-CM

## 2020-02-05 DIAGNOSIS — M79676 Pain in unspecified toe(s): Secondary | ICD-10-CM

## 2020-02-09 NOTE — Progress Notes (Signed)
    Subjective: Patient is a 69 y.o. female presenting to the office today for follow up evaluation of painful callus lesions to the bilateral feet. Bearing weight increases the pain. She has not had any treatment at home.  She also complains of elongated, thickened nails that cause pain while ambulating in shoes. She is unable to trim her own nails. Patient presents today for further treatment and evaluation.   Past Medical History:  Diagnosis Date  . Arthritis   . Hypertension     Objective:  Physical Exam General: Alert and oriented x3 in no acute distress  Dermatology: Hyperkeratotic lesion present on the bilateral feet x 5. Pain on palpation with a central nucleated core noted. Skin is warm, dry and supple bilateral lower extremities. Negative for open lesions or macerations. Nails are tender, long, thickened and dystrophic with subungual debris, consistent with onychomycosis, 1-5 bilateral. No signs of infection noted.  Vascular: Palpable pedal pulses bilaterally. No edema or erythema noted. Capillary refill within normal limits.  Neurological: Epicritic and protective threshold grossly intact bilaterally.   Musculoskeletal Exam: Pain on palpation at the keratotic lesion noted. Range of motion within normal limits bilateral. Muscle strength 5/5 in all groups bilateral.  Assessment: 1. Onychodystrophic nails 1-5 bilateral with hyperkeratosis of nails.  2. Onychomycosis of nail due to dermatophyte bilateral 3. Pre-ulcerative calluses to the bilateral feet x 5   Plan of Care:  #1 Patient evaluated. #2 Excisional debridement of keratoic lesion using a chisel blade was performed without incident.  #3 Dressed with light dressing. #4 Mechanical debridement of nails 1-5 bilaterally performed using a nail nipper. Filed with dremel without incident.  #5 Patient is to return to the clinic in 3 months.   Felecia Shelling, DPM [facility]  Dr. Felecia Shelling, DPM      [address]                                        [place], [place] [address]                Office [phone]  Fax [phone]

## 2020-02-24 DIAGNOSIS — R21 Rash and other nonspecific skin eruption: Secondary | ICD-10-CM | POA: Diagnosis not present

## 2020-03-28 DIAGNOSIS — M1611 Unilateral primary osteoarthritis, right hip: Secondary | ICD-10-CM | POA: Diagnosis not present

## 2020-03-28 DIAGNOSIS — M5136 Other intervertebral disc degeneration, lumbar region: Secondary | ICD-10-CM | POA: Diagnosis not present

## 2020-07-04 ENCOUNTER — Encounter: Payer: Self-pay | Admitting: Podiatry

## 2020-07-04 ENCOUNTER — Other Ambulatory Visit: Payer: Self-pay

## 2020-07-04 ENCOUNTER — Ambulatory Visit: Payer: Medicare HMO | Admitting: Podiatry

## 2020-07-04 ENCOUNTER — Ambulatory Visit (INDEPENDENT_AMBULATORY_CARE_PROVIDER_SITE_OTHER): Payer: Medicare Other | Admitting: Podiatry

## 2020-07-04 DIAGNOSIS — B351 Tinea unguium: Secondary | ICD-10-CM

## 2020-07-04 DIAGNOSIS — Q828 Other specified congenital malformations of skin: Secondary | ICD-10-CM

## 2020-07-04 DIAGNOSIS — M79676 Pain in unspecified toe(s): Secondary | ICD-10-CM

## 2020-07-04 DIAGNOSIS — L989 Disorder of the skin and subcutaneous tissue, unspecified: Secondary | ICD-10-CM

## 2020-07-04 NOTE — Progress Notes (Signed)
This patient returns to the office for evaluation and treatment of long thick painful nails and painful calluses both feet. .  This patient is unable to trim his own nails and trim her calluses  since the patient cannot reach his feet.  Patient says the nails are painful walking and wearing his shoes.  He returns for preventive foot care services.  General Appearance  Alert, conversant and in no acute stress.  Vascular  Dorsalis pedis are palpable  Bilaterally.  Posterior tibial pulses are not palpable  B/L  Capillary return is within normal limits  bilaterally. Temperature is within normal limits  bilaterally.  Neurologic  Senn-Weinstein monofilament wire test within normal limits  bilaterally. Muscle power within normal limits bilaterally.  Nails Thick disfigured discolored nails with subungual debris  from hallux to fifth toes bilaterally. No evidence of bacterial infection or drainage bilaterally.  Orthopedic  No limitations of motion  feet .  No crepitus or effusions noted.  No bony pathology or digital deformities noted.  Skin  normotropic skin  noted bilaterally.  No signs of infections or ulcers noted.   Callus sub 2,5  B/L.  Onychomycosis  Pain in toes right foot  Pain in toes left foot.  Porokeratosis/callus  B/L  Debridement  of nails  1-5  B/L with a nail nipper.  Nails were then filed using a dremel tool with no incidents.  Debride callus using a # 15 blade.    RTC  3 months    Helane Gunther DPM

## 2020-10-10 ENCOUNTER — Other Ambulatory Visit: Payer: Self-pay

## 2020-10-10 ENCOUNTER — Ambulatory Visit (INDEPENDENT_AMBULATORY_CARE_PROVIDER_SITE_OTHER): Payer: Medicare Other | Admitting: Podiatry

## 2020-10-10 ENCOUNTER — Encounter: Payer: Self-pay | Admitting: Podiatry

## 2020-10-10 DIAGNOSIS — M79676 Pain in unspecified toe(s): Secondary | ICD-10-CM

## 2020-10-10 DIAGNOSIS — L989 Disorder of the skin and subcutaneous tissue, unspecified: Secondary | ICD-10-CM

## 2020-10-10 DIAGNOSIS — Q828 Other specified congenital malformations of skin: Secondary | ICD-10-CM

## 2020-10-10 DIAGNOSIS — B351 Tinea unguium: Secondary | ICD-10-CM | POA: Diagnosis not present

## 2020-10-10 NOTE — Progress Notes (Signed)
This patient returns to the office for evaluation and treatment of long thick painful nails and painful calluses both feet. .  This patient is unable to trim his own nails and trim her calluses  since the patient cannot reach her feet.  Patient says the nails are painful walking and wearing his shoes.  He returns for preventive foot care services.  General Appearance  Alert, conversant and in no acute stress.  Vascular  Dorsalis pedis are palpable  Bilaterally.  Posterior tibial pulses are not palpable  B/L  Capillary return is within normal limits  bilaterally. Temperature is within normal limits  Bilaterally. Absent hair.  Neurologic  Senn-Weinstein monofilament wire test within normal limits  bilaterally. Muscle power within normal limits bilaterally.  Nails Thick disfigured discolored nails with subungual debris  Hallux nail left foot. No evidence of bacterial infection or drainage bilaterally.  Orthopedic  No limitations of motion  feet .  No crepitus or effusions noted.  No bony pathology or digital deformities noted.  Skin  normotropic skin  noted bilaterally.  No signs of infections or ulcers noted.   Callus sub 2,5  B/L. Callus sub 5th  Right hallux.  Onychomycosis  Pain in toes right foot  Pain in toes left foot.  Porokeratosis/callus  B/L  Debridement  of nails  1-5  B/L with a nail nipper.  Nails were then filed using a dremel tool with no incidents.  Debride callus using a # 15 blade.    RTC  3 months    Helane Gunther DPM

## 2020-10-27 ENCOUNTER — Other Ambulatory Visit: Payer: Self-pay | Admitting: Family Medicine

## 2020-10-27 DIAGNOSIS — Z1231 Encounter for screening mammogram for malignant neoplasm of breast: Secondary | ICD-10-CM

## 2021-01-03 ENCOUNTER — Other Ambulatory Visit: Payer: Self-pay

## 2021-01-03 ENCOUNTER — Ambulatory Visit
Admission: RE | Admit: 2021-01-03 | Discharge: 2021-01-03 | Disposition: A | Discharge status: Home / Self Care | Payer: Medicare Other | Source: Ambulatory Visit | Attending: Family Medicine | Admitting: Family Medicine

## 2021-01-03 DIAGNOSIS — Z1231 Encounter for screening mammogram for malignant neoplasm of breast: Secondary | ICD-10-CM | POA: Diagnosis present

## 2021-01-09 ENCOUNTER — Other Ambulatory Visit: Payer: Self-pay

## 2021-01-09 ENCOUNTER — Encounter: Payer: Self-pay | Admitting: Podiatry

## 2021-01-09 ENCOUNTER — Ambulatory Visit (INDEPENDENT_AMBULATORY_CARE_PROVIDER_SITE_OTHER): Payer: Medicare Other | Admitting: Podiatry

## 2021-01-09 DIAGNOSIS — M79676 Pain in unspecified toe(s): Secondary | ICD-10-CM

## 2021-01-09 DIAGNOSIS — Q828 Other specified congenital malformations of skin: Secondary | ICD-10-CM

## 2021-01-09 DIAGNOSIS — B351 Tinea unguium: Secondary | ICD-10-CM | POA: Diagnosis not present

## 2021-01-09 DIAGNOSIS — L989 Disorder of the skin and subcutaneous tissue, unspecified: Secondary | ICD-10-CM

## 2021-01-09 NOTE — Progress Notes (Signed)
This patient returns to the office for evaluation and treatment of long thick painful nails and painful calluses both feet. .  This patient is unable to trim his own nails and trim her calluses  since the patient cannot reach her feet.  Patient says the nails are painful walking and wearing his shoes.  He returns for preventive foot care services.  General Appearance  Alert, conversant and in no acute stress.  Vascular  Dorsalis pedis are palpable  Bilaterally.  Posterior tibial pulses are not palpable  B/L  Capillary return is within normal limits  Bilaterally.  Cold feet.   Bilaterally. Absent hair.  Neurologic  Senn-Weinstein monofilament wire test within normal limits  bilaterally. Muscle power within normal limits bilaterally.  Nails Thick disfigured discolored nails with subungual debris hallux nails-fifth toenails . No evidence of bacterial infection or drainage bilaterally.  Orthopedic  No limitations of motion  feet .  No crepitus or effusions noted.  No bony pathology or digital deformities noted.  Skin  normotropic skin  noted bilaterally.  No signs of infections or ulcers noted.   Callus sub 2,5  B/L. Callus sub 5th  Right hallux.  Onychomycosis  Pain in toes right foot  Pain in toes left foot.  Porokeratosis/callus  B/L  Debridement  of nails  1-5  B/L with a nail nipper.  Nails were then filed using a dremel tool with no incidents.  Debride callus using a # 15 blade.    RTC  10 weeks    Leshae Mcclay DPM  

## 2021-02-20 ENCOUNTER — Ambulatory Visit: Payer: Medicare Other | Admitting: Podiatry

## 2021-03-23 ENCOUNTER — Ambulatory Visit: Payer: Medicare Other | Admitting: Podiatry

## 2021-08-31 IMAGING — MG MM DIGITAL SCREENING BILAT W/ TOMO AND CAD
6 of 12 series · 6 of 36 positions shown · non-contrast
Comparison: Previous exam(s).

ACR Breast Density Category a: The breast tissue is almost entirely
fatty.

CLINICAL DATA: Screening.

EXAM:
DIGITAL SCREENING BILATERAL MAMMOGRAM WITH TOMOSYNTHESIS AND CAD
TECHNIQUE: Bilateral screening digital craniocaudal and mediolateral oblique
mammograms were obtained. Bilateral screening digital breast
tomosynthesis was performed. The images were evaluated with
computer-aided detection.

[R MLO synth-2D (1 of 2)]
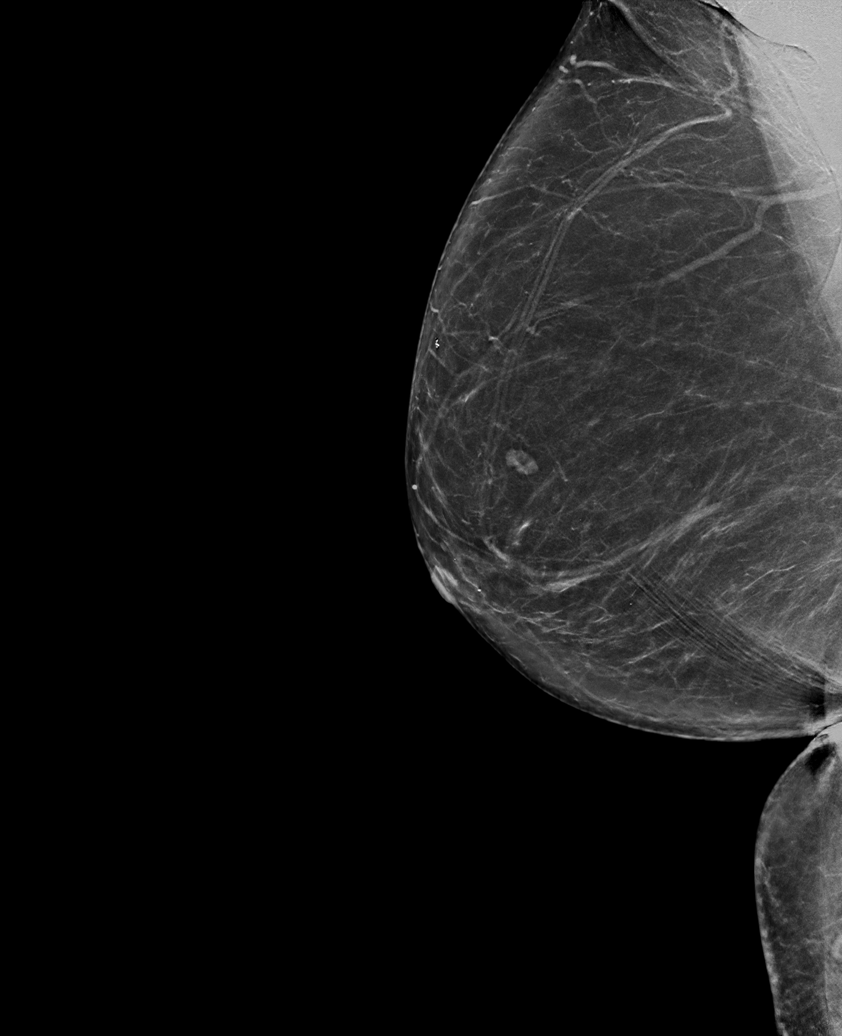

[R MLO synth-2D (2 of 2)]
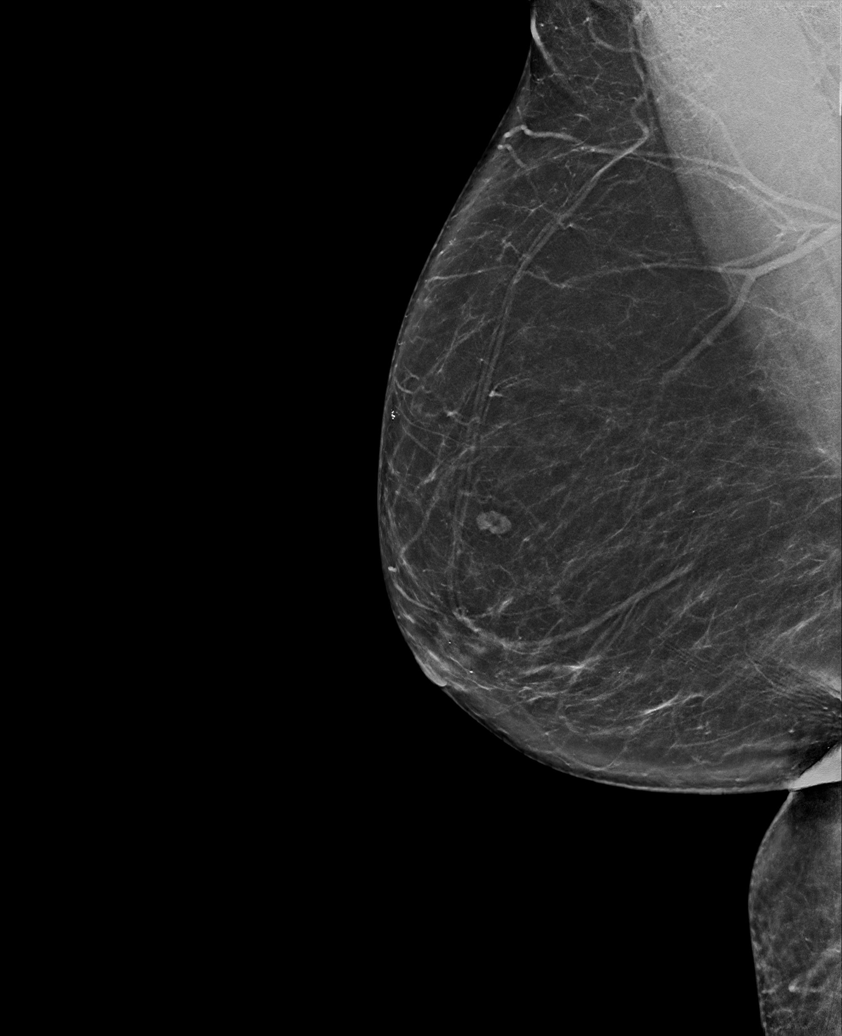

[L MLO synth-2D (1 of 2)]
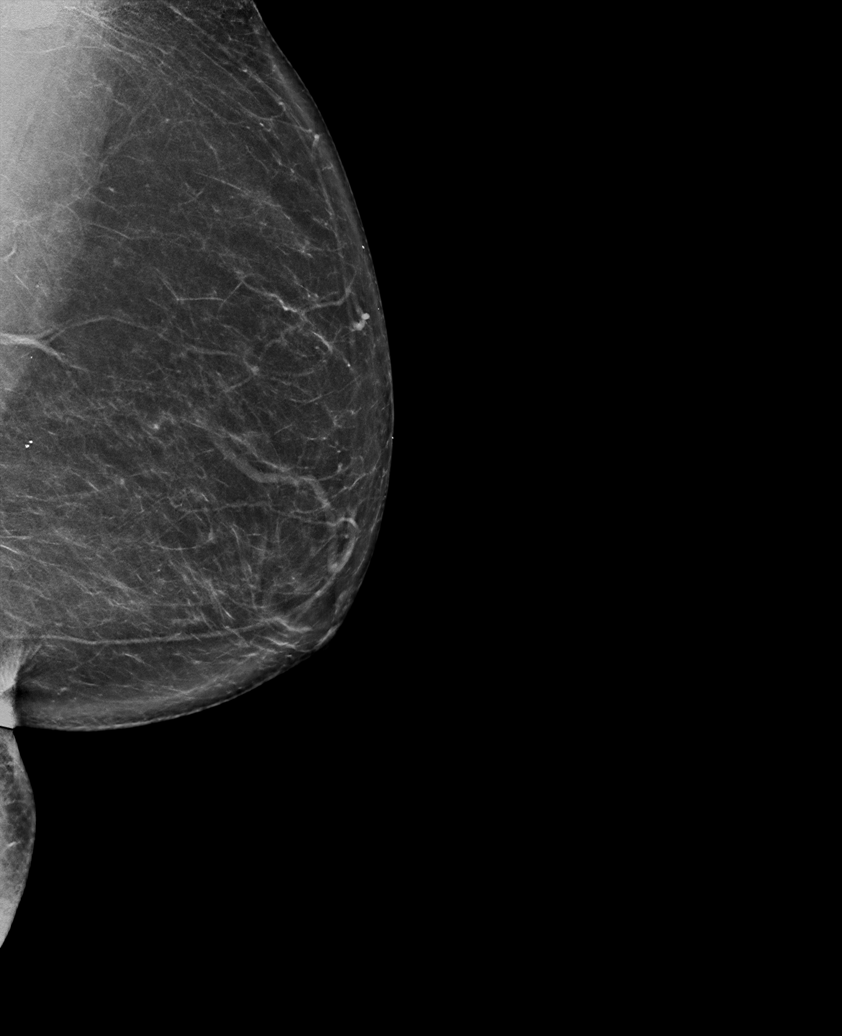

[L CC synth-2D]
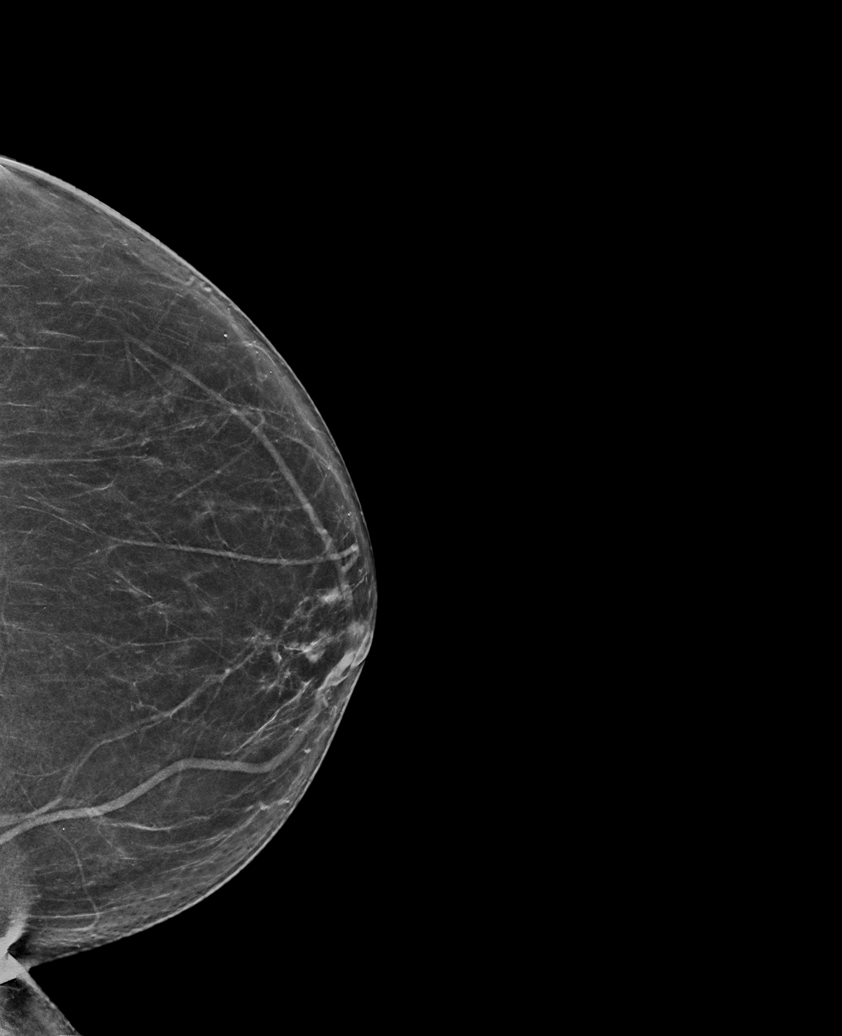

[L MLO synth-2D (2 of 2)]
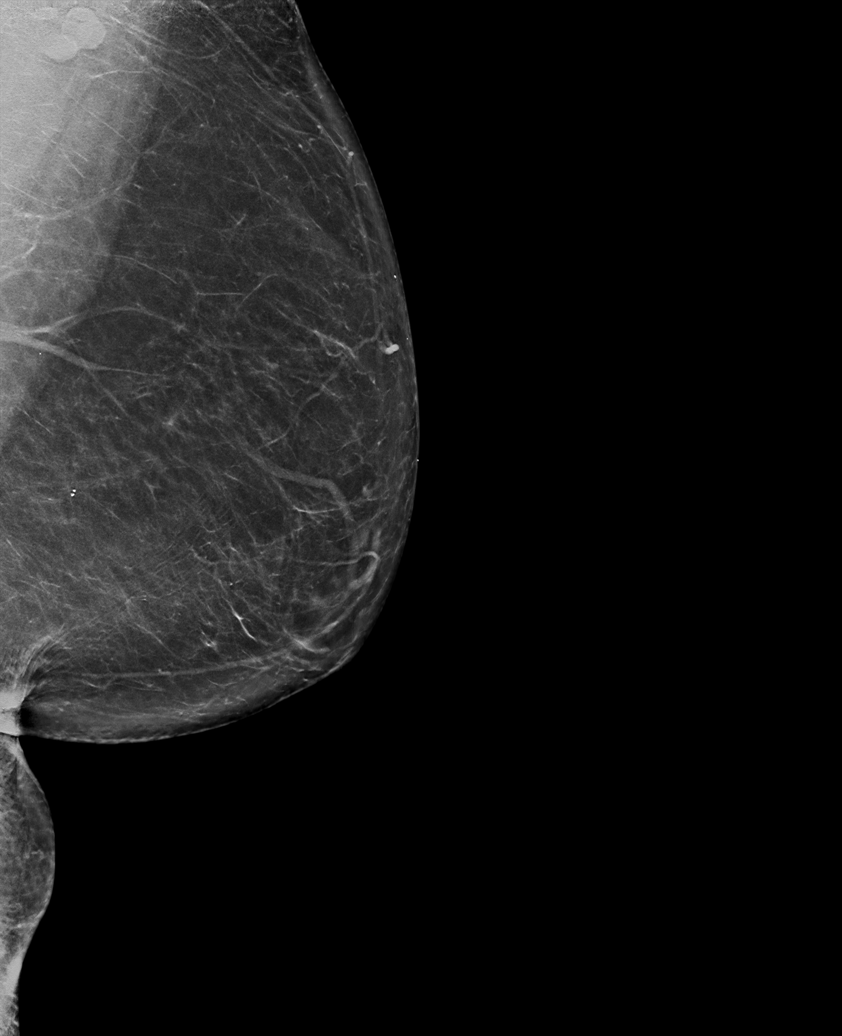

[R CC synth-2D]
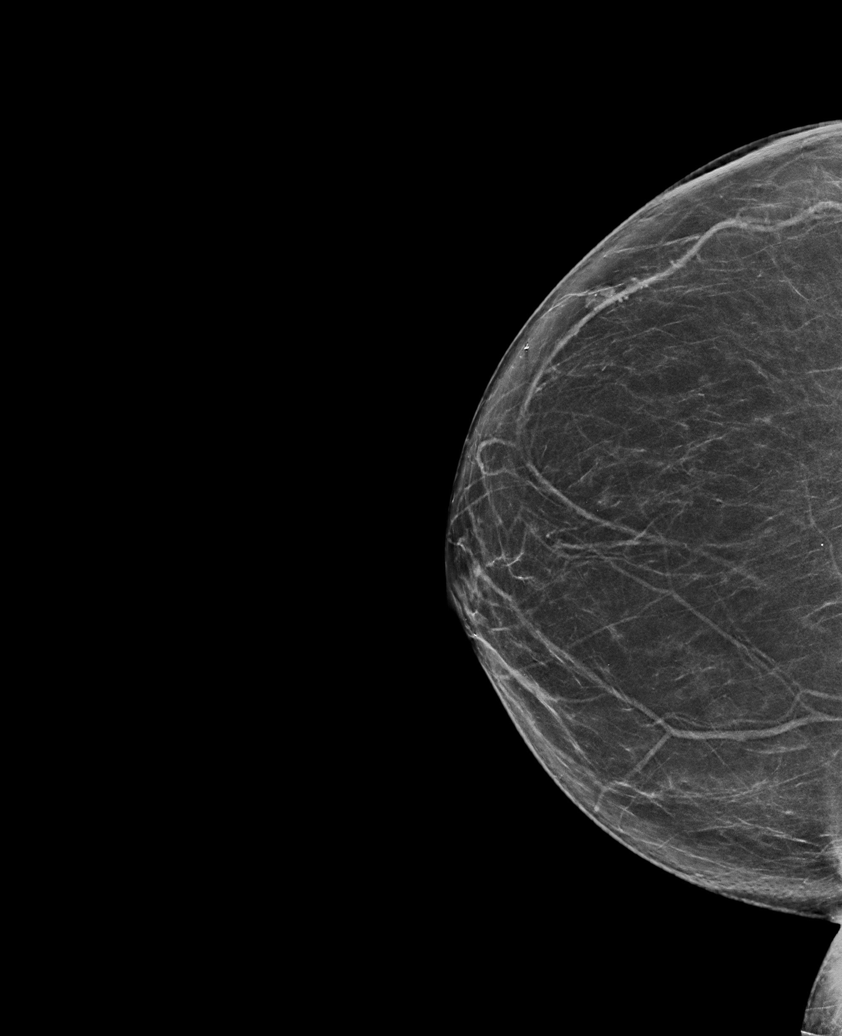

[6 of 36 positions shown; findings below may reference images not displayed]

FINDINGS: There are no findings suspicious for malignancy.
IMPRESSION: No mammographic evidence of malignancy. A result letter of this
screening mammogram will be mailed directly to the patient.

RECOMMENDATION:
Screening mammogram in one year. (Code:0E-3-N98)

BI-RADS CATEGORY  1: Negative.

## 2021-11-16 ENCOUNTER — Encounter: Payer: Self-pay | Admitting: Podiatry

## 2021-12-07 ENCOUNTER — Ambulatory Visit: Payer: Medicare Other | Admitting: Podiatry

## 2022-02-22 ENCOUNTER — Encounter: Payer: Self-pay | Admitting: Podiatry

## 2022-02-22 ENCOUNTER — Ambulatory Visit (INDEPENDENT_AMBULATORY_CARE_PROVIDER_SITE_OTHER): Payer: Medicare Other | Admitting: Podiatry

## 2022-02-22 DIAGNOSIS — L989 Disorder of the skin and subcutaneous tissue, unspecified: Secondary | ICD-10-CM

## 2022-02-22 DIAGNOSIS — Q828 Other specified congenital malformations of skin: Secondary | ICD-10-CM | POA: Diagnosis not present

## 2022-02-22 DIAGNOSIS — B351 Tinea unguium: Secondary | ICD-10-CM

## 2022-02-22 DIAGNOSIS — M79676 Pain in unspecified toe(s): Secondary | ICD-10-CM | POA: Diagnosis not present

## 2022-02-22 NOTE — Progress Notes (Signed)
This patient returns to the office for evaluation and treatment of long thick painful nails and painful calluses both feet. .  This patient is unable to trim his own nails and trim her calluses  since the patient cannot reach her feet.  Patient says the nails are painful walking and wearing his shoes.  He returns for preventive foot care services.  General Appearance  Alert, conversant and in no acute stress.  Vascular  Dorsalis pedis are palpable  Bilaterally.  Posterior tibial pulses are not palpable  B/L  Capillary return is within normal limits  Bilaterally.  Cold feet.   Bilaterally. Absent hair.  Neurologic  Senn-Weinstein monofilament wire test within normal limits  bilaterally. Muscle power within normal limits bilaterally.  Nails Thick disfigured discolored nails with subungual debris hallux nails-fifth toenails . No evidence of bacterial infection or drainage bilaterally.  Orthopedic  No limitations of motion  feet .  No crepitus or effusions noted.  No bony pathology or digital deformities noted.  Skin  normotropic skin  noted bilaterally.  No signs of infections or ulcers noted.   Callus sub 2,5  B/L. Callus sub 5th  Right hallux.  Onychomycosis  Pain in toes right foot  Pain in toes left foot.  Porokeratosis/callus  B/L  Debridement  of nails  1-5  B/L with a nail nipper.  Nails were then filed using a dremel tool with no incidents.  Debride callus using a # 15 blade.    RTC  10 weeks    Ethelbert Thain DPM  

## 2022-03-15 DIAGNOSIS — D649 Anemia, unspecified: Secondary | ICD-10-CM | POA: Insufficient documentation

## 2022-03-15 DIAGNOSIS — I10 Essential (primary) hypertension: Secondary | ICD-10-CM | POA: Insufficient documentation

## 2022-03-15 DIAGNOSIS — E785 Hyperlipidemia, unspecified: Secondary | ICD-10-CM | POA: Insufficient documentation

## 2022-03-15 DIAGNOSIS — J309 Allergic rhinitis, unspecified: Secondary | ICD-10-CM | POA: Insufficient documentation

## 2022-03-16 ENCOUNTER — Other Ambulatory Visit: Payer: Self-pay | Admitting: Family Medicine

## 2022-03-16 DIAGNOSIS — Z1231 Encounter for screening mammogram for malignant neoplasm of breast: Secondary | ICD-10-CM

## 2022-05-24 ENCOUNTER — Encounter: Payer: Self-pay | Admitting: Podiatry

## 2022-05-24 ENCOUNTER — Ambulatory Visit (INDEPENDENT_AMBULATORY_CARE_PROVIDER_SITE_OTHER): Payer: Medicare Other | Admitting: Podiatry

## 2022-05-24 DIAGNOSIS — M79675 Pain in left toe(s): Secondary | ICD-10-CM

## 2022-05-24 DIAGNOSIS — B351 Tinea unguium: Secondary | ICD-10-CM | POA: Diagnosis not present

## 2022-05-24 DIAGNOSIS — M79674 Pain in right toe(s): Secondary | ICD-10-CM | POA: Diagnosis not present

## 2022-05-24 DIAGNOSIS — L989 Disorder of the skin and subcutaneous tissue, unspecified: Secondary | ICD-10-CM | POA: Diagnosis not present

## 2022-05-24 NOTE — Progress Notes (Signed)
This patient returns to the office for evaluation and treatment of long thick painful nails and painful calluses both feet. .  This patient is unable to trim his own nails and trim her calluses  since the patient cannot reach her feet.  Patient says the nails are painful walking and wearing his shoes.  He returns for preventive foot care services.  General Appearance  Alert, conversant and in no acute stress.  Vascular  Dorsalis pedis are palpable  Bilaterally.  Posterior tibial pulses are not palpable  B/L  Capillary return is within normal limits  Bilaterally.  Cold feet.   Bilaterally. Absent hair.  Neurologic  Senn-Weinstein monofilament wire test within normal limits  bilaterally. Muscle power within normal limits bilaterally.  Nails Thick disfigured discolored nails with subungual debris hallux nails-fifth toenails . No evidence of bacterial infection or drainage bilaterally.  Orthopedic  No limitations of motion  feet .  No crepitus or effusions noted.  No bony pathology or digital deformities noted.  Skin  normotropic skin  noted bilaterally.  No signs of infections or ulcers noted.   Callus sub 2,5  B/L. Callus sub 5th  Right hallux.  Onychomycosis  Pain in toes right foot  Pain in toes left foot.  Porokeratosis/callus  B/L  Debridement  of nails  1-5  B/L with a nail nipper.  Nails were then filed using a dremel tool with no incidents.  Debride callus using a # 15 blade.    RTC  10 weeks    Sheila Gervasi DPM  

## 2022-08-02 ENCOUNTER — Ambulatory Visit (INDEPENDENT_AMBULATORY_CARE_PROVIDER_SITE_OTHER): Payer: Medicare Other | Admitting: Podiatry

## 2022-08-02 ENCOUNTER — Encounter: Payer: Self-pay | Admitting: Podiatry

## 2022-08-02 DIAGNOSIS — L989 Disorder of the skin and subcutaneous tissue, unspecified: Secondary | ICD-10-CM

## 2022-08-02 DIAGNOSIS — M79676 Pain in unspecified toe(s): Secondary | ICD-10-CM | POA: Diagnosis not present

## 2022-08-02 DIAGNOSIS — B351 Tinea unguium: Secondary | ICD-10-CM

## 2022-08-02 DIAGNOSIS — Q828 Other specified congenital malformations of skin: Secondary | ICD-10-CM

## 2022-08-02 NOTE — Progress Notes (Signed)
This patient returns to the office for evaluation and treatment of long thick painful nails and painful calluses both feet. .  This patient is unable to trim his own nails and trim her calluses  since the patient cannot reach her feet.  Patient says the nails are painful walking and wearing his shoes.  He returns for preventive foot care services.  General Appearance  Alert, conversant and in no acute stress.  Vascular  Dorsalis pedis are palpable  Bilaterally.  Posterior tibial pulses are not palpable  B/L  Capillary return is within normal limits  Bilaterally.  Cold feet.   Bilaterally. Absent hair.  Neurologic  Senn-Weinstein monofilament wire test within normal limits  bilaterally. Muscle power within normal limits bilaterally.  Nails Thick disfigured discolored nails with subungual debris hallux nails-fifth toenails . No evidence of bacterial infection or drainage bilaterally.  Orthopedic  No limitations of motion  feet .  No crepitus or effusions noted.  No bony pathology or digital deformities noted.  Skin  normotropic skin  noted bilaterally.  No signs of infections or ulcers noted.   Callus sub 2,5  B/L. Callus sub 5th  Right hallux.  Onychomycosis  Pain in toes right foot  Pain in toes left foot.  Porokeratosis/callus  B/L  Debridement  of nails  1-5  B/L with a nail nipper.  Nails were then filed using a dremel tool with no incidents.  Debride callus using a # 15 blade.    RTC  10 weeks    Gardiner Barefoot DPM

## 2022-11-01 ENCOUNTER — Encounter: Payer: Self-pay | Admitting: Podiatry

## 2022-11-01 ENCOUNTER — Ambulatory Visit (INDEPENDENT_AMBULATORY_CARE_PROVIDER_SITE_OTHER): Payer: Medicare Other | Admitting: Podiatry

## 2022-11-01 DIAGNOSIS — M79676 Pain in unspecified toe(s): Secondary | ICD-10-CM | POA: Diagnosis not present

## 2022-11-01 DIAGNOSIS — B351 Tinea unguium: Secondary | ICD-10-CM

## 2022-11-01 DIAGNOSIS — Q828 Other specified congenital malformations of skin: Secondary | ICD-10-CM

## 2022-11-01 NOTE — Progress Notes (Signed)
This patient returns to the office for evaluation and treatment of long thick painful nails and painful calluses both feet. .  This patient is unable to trim his own nails and trim her calluses  since the patient cannot reach her feet.  Patient says the nails are painful walking and wearing his shoes.  He returns for preventive foot care services.  General Appearance  Alert, conversant and in no acute stress.  Vascular  Dorsalis pedis are palpable  Bilaterally.  Posterior tibial pulses are not palpable  B/L  Capillary return is within normal limits  Bilaterally.  Cold feet.   Bilaterally. Absent hair.  Neurologic  Senn-Weinstein monofilament wire test within normal limits  bilaterally. Muscle power within normal limits bilaterally.  Nails Thick disfigured discolored nails with subungual debris hallux nails-fifth toenails . No evidence of bacterial infection or drainage bilaterally.  Orthopedic  No limitations of motion  feet .  No crepitus or effusions noted.  No bony pathology or digital deformities noted.  Skin  normotropic skin  noted bilaterally.  No signs of infections or ulcers noted.   Callus sub 2,5  B/L. Callus sub 5th  Right hallux.  Onychomycosis  Pain in toes right foot  Pain in toes left foot.  Porokeratosis/callus  B/L  Debridement  of nails  1-5  B/L with a nail nipper.  Nails were then filed using a dremel tool with no incidents.  Debride callus using a dremel tool.  RTC  10 weeks    Helane Gunther DPM

## 2023-03-07 ENCOUNTER — Ambulatory Visit: Payer: 59 | Admitting: Podiatry

## 2023-03-28 ENCOUNTER — Ambulatory Visit: Payer: 59 | Admitting: Podiatry

## 2023-04-25 ENCOUNTER — Ambulatory Visit (INDEPENDENT_AMBULATORY_CARE_PROVIDER_SITE_OTHER): Payer: 59 | Admitting: Podiatry

## 2023-04-25 ENCOUNTER — Encounter: Payer: Self-pay | Admitting: Podiatry

## 2023-04-25 VITALS — BP 134/71 | HR 73

## 2023-04-25 DIAGNOSIS — B351 Tinea unguium: Secondary | ICD-10-CM

## 2023-04-25 DIAGNOSIS — Q828 Other specified congenital malformations of skin: Secondary | ICD-10-CM

## 2023-04-25 DIAGNOSIS — M79676 Pain in unspecified toe(s): Secondary | ICD-10-CM

## 2023-04-25 DIAGNOSIS — I739 Peripheral vascular disease, unspecified: Secondary | ICD-10-CM

## 2023-04-28 ENCOUNTER — Encounter: Payer: Self-pay | Admitting: Podiatry

## 2023-04-28 NOTE — Progress Notes (Signed)
  Subjective:  Patient ID: Sarah Buck, female    DOB: 1951/07/25,  MRN: [id number]  Sarah Buck presents to clinic today for at risk foot care. Patient has h/o PAD and callus(es) both feet and painful thick toenails that are difficult to trim. Painful toenails interfere with ambulation. Aggravating factors include wearing enclosed shoe gear. Pain is relieved with periodic professional debridement. Painful calluses are aggravated when weightbearing with and without shoegear. Pain is relieved with periodic professional debridement.  Chief Complaint  Patient presents with   Nail Problem    "Do my toenails and my bunions."   New problem(s): None.   PCP is Jerl Mina, MD.  No Known Allergies  Review of Systems: Negative except as noted in the HPI.  Objective: No changes noted in today's physical examination. Vitals:   04/25/23 1609  BP: 134/71  Pulse: 73   Sarah Buck is a pleasant 72 y.o. female WD, WN in NAD. AAO x 3.  Vascular Examination: CFT <3 seconds b/l. DP pulses faintly palpable b/l. PT pulses nonpalpable b/l. Digital hair absent. Skin temperature gradient warm to warm b/l. No pain with calf compression. No ischemia or gangrene. No cyanosis or clubbing noted b/l.    Neurological Examination: Sensation grossly intact b/l with 10 gram monofilament. Vibratory sensation intact b/l.   Dermatological Examination: Pedal skin warm and supple b/l. No open wounds b/l. No interdigital macerations. Toenails 1-5 b/l thick, discolored, elongated with subungual debris and pain on dorsal palpation.  Hyperkeratotic lesion(s) submet head 2 b/l and submet head 5 b/l.  No erythema, no edema, no drainage, no fluctuance.  Musculoskeletal Examination: Normal muscle strength 5/5 to all lower extremity muscle groups bilaterally. HAV with bunion deformity noted b/l LE.Marland Kitchen No pain, crepitus or joint limitation noted with ROM b/l LE.  Patient ambulates independently without assistive  aids.  Radiographs: None  Assessment/Plan: 1. Pain due to onychomycosis of toenail   2. Porokeratosis   3. PAD (peripheral artery disease) (HCC)     -Consent given for treatment as described below: -Examined patient. -Patient to continue soft, supportive shoe gear daily. -Toenails 1-5 b/l were debrided in length and girth with sterile nail nippers and dremel without iatrogenic bleeding.  -Callus(es) submet head 2 b/l and submet head 5 b/l pared utilizing sterile scalpel blade without complication or incident. Total number debrided =4. -Patient/POA to call should there be question/concern in the interim.   Return in about 10 weeks (around 07/04/2023).  Freddie Breech, DPM

## 2023-07-04 ENCOUNTER — Ambulatory Visit (INDEPENDENT_AMBULATORY_CARE_PROVIDER_SITE_OTHER): Payer: 59 | Admitting: Podiatry

## 2023-07-04 ENCOUNTER — Encounter: Payer: Self-pay | Admitting: Podiatry

## 2023-07-04 DIAGNOSIS — M79676 Pain in unspecified toe(s): Secondary | ICD-10-CM

## 2023-07-04 DIAGNOSIS — B351 Tinea unguium: Secondary | ICD-10-CM | POA: Diagnosis not present

## 2023-07-04 DIAGNOSIS — B353 Tinea pedis: Secondary | ICD-10-CM

## 2023-07-04 DIAGNOSIS — I739 Peripheral vascular disease, unspecified: Secondary | ICD-10-CM

## 2023-07-04 DIAGNOSIS — Q828 Other specified congenital malformations of skin: Secondary | ICD-10-CM

## 2023-07-05 MED ORDER — KETOCONAZOLE 2 % EX CREA: TOPICAL_CREAM | CUTANEOUS | 1 refills | Status: AC

## 2023-07-05 NOTE — Progress Notes (Signed)
  Subjective:  Patient ID: Sarah Buck, female    DOB: Feb 08, 1951,  MRN: [id number]  Sarah Buck presents to clinic today for at risk foot care. Patient has h/o PAD and painful porokeratotic lesion(s) of both feet and painful mycotic toenails that limit ambulation. Painful toenails interfere with ambulation. Aggravating factors include wearing enclosed shoe gear. Pain is relieved with periodic professional debridement. Painful porokeratotic lesions are aggravated when weightbearing with and without shoegear. Pain is relieved with periodic professional debridement.  Chief Complaint  Patient presents with   Nail Problem    RFC,Referring Provider Jerl Mina, MD,lov:03/24      New problem(s): None.   PCP is Jerl Mina, MD.  No Known Allergies  Review of Systems: Negative except as noted in the HPI.  Objective:  There were no vitals filed for this visit. Sarah Buck is a pleasant 72 y.o. female WD, WN in NAD. AAO x 3.  Vascular Examination: CFT <3 seconds b/l. DP pulses faintly palpable b/l. PT pulses nonpalpable b/l. Digital hair absent. Skin temperature gradient warm to warm b/l. No pain with calf compression. No ischemia or gangrene. No cyanosis or clubbing noted b/l.    Neurological Examination: Sensation grossly intact b/l with 10 gram monofilament. Vibratory sensation intact b/l.   Dermatological Examination: Pedal skin warm and supple b/l. No open wounds b/l. No interdigital macerations. Toenails 1-5 b/l thick, discolored, elongated with subungual debris and pain on dorsal palpation.  Diffuse scaling noted peripherally and plantarly b/l feet.  No interdigital macerations.  No blisters, no weeping. No signs of secondary bacterial infection noted.  Hyperkeratotic lesion(s) submet head 2 b/l and submet head 5 b/l.  No erythema, no edema, no drainage, no fluctuance  Musculoskeletal Examination: Muscle strength 5/5 to all lower extremity muscle groups bilaterally.  Plantarflexed metatarsal(s) 2nd metatarsal head of both feet and 5th metatarsal head of both feet. Patient ambulates independent of any assistive aids.  Radiographs: None  Assessment/Plan: 1. Pain due to onychomycosis of toenail   2. Porokeratosis   3. Tinea pedis of both feet   4. PAD (peripheral artery disease) (HCC)     Meds ordered this encounter  Medications   ketoconazole (NIZORAL) 2 % cream    Sig: Apply to both feet and between toes once daily for 6 weeks.    Dispense:  60 g    Refill:  1   -Consent given for treatment as described below: -Examined patient. -Patient to continue soft, supportive shoe gear daily. -Toenails 1-5 b/l were debrided in length and girth with sterile nail nippers without iatrogenic bleeding.  -Discussed tinea pedis infection. To prevent re-infection of tinea pedis, patient/POA/caregiver instructed to spray shoes with Lysol every evening and clean tub/shower with bleach based cleanser. -Rx refilled for Ketoconazole Cream 2% to be applied once daily for six weeks. -Patient/POA to call should there be question/concern in the interim.   Return in about 10 weeks (around 09/12/2023).  Freddie Breech, DPM

## 2023-07-08 ENCOUNTER — Inpatient Hospital Stay: Admission: RE | Admit: 2023-07-08 | Payer: 59 | Source: Ambulatory Visit

## 2023-07-09 ENCOUNTER — Other Ambulatory Visit: Payer: Self-pay | Admitting: Surgery

## 2023-07-10 ENCOUNTER — Encounter
Admission: RE | Admit: 2023-07-10 | Discharge: 2023-07-10 | Disposition: A | Discharge status: Home / Self Care | Payer: 59 | Source: Ambulatory Visit | Attending: Surgery | Admitting: Surgery

## 2023-07-10 ENCOUNTER — Telehealth: Payer: Self-pay | Admitting: Urgent Care

## 2023-07-10 ENCOUNTER — Other Ambulatory Visit: Payer: Self-pay

## 2023-07-10 VITALS — BP 127/80 | HR 77 | Temp 98.5°F | Resp 16 | Ht 65.0 in | Wt 161.0 lb

## 2023-07-10 DIAGNOSIS — E876 Hypokalemia: Secondary | ICD-10-CM

## 2023-07-10 DIAGNOSIS — Z79899 Other long term (current) drug therapy: Secondary | ICD-10-CM | POA: Insufficient documentation

## 2023-07-10 DIAGNOSIS — Z01810 Encounter for preprocedural cardiovascular examination: Secondary | ICD-10-CM | POA: Diagnosis not present

## 2023-07-10 DIAGNOSIS — Z01812 Encounter for preprocedural laboratory examination: Secondary | ICD-10-CM | POA: Insufficient documentation

## 2023-07-10 DIAGNOSIS — T502X5A Adverse effect of carbonic-anhydrase inhibitors, benzothiadiazides and other diuretics, initial encounter: Secondary | ICD-10-CM | POA: Insufficient documentation

## 2023-07-10 DIAGNOSIS — Z01818 Encounter for other preprocedural examination: Secondary | ICD-10-CM | POA: Diagnosis present

## 2023-07-10 HISTORY — DX: Dermatitis, unspecified: L30.9

## 2023-07-10 HISTORY — DX: Essential (primary) hypertension: I10

## 2023-07-10 HISTORY — DX: Unspecified asthma, uncomplicated: J45.909

## 2023-07-10 HISTORY — DX: Peripheral vascular disease, unspecified: I73.9

## 2023-07-10 HISTORY — DX: Hyperlipidemia, unspecified: E78.5

## 2023-07-10 HISTORY — DX: Chronic obstructive pulmonary disease, unspecified: J44.9

## 2023-07-10 HISTORY — DX: Cortical age-related cataract, unspecified eye: H25.019

## 2023-07-10 HISTORY — DX: Other intervertebral disc degeneration, lumbar region without mention of lumbar back pain or lower extremity pain: M51.369

## 2023-07-10 HISTORY — DX: Pneumonia, unspecified organism: J18.9

## 2023-07-10 HISTORY — DX: Gout, unspecified: M10.9

## 2023-07-10 LAB — CBC WITH DIFFERENTIAL/PLATELET
Abs Immature Granulocytes: 0.01 10*3/uL (ref 0.00–0.07)
Basophils Absolute: 0 10*3/uL (ref 0.0–0.1)
Basophils Relative: 0 %
Eosinophils Absolute: 0.1 10*3/uL (ref 0.0–0.5)
Eosinophils Relative: 3 %
HCT: 35.8 % — ABNORMAL LOW (ref 36.0–46.0)
Hemoglobin: 11.7 g/dL — ABNORMAL LOW (ref 12.0–15.0)
Immature Granulocytes: 0 %
Lymphocytes Relative: 29 %
Lymphs Abs: 1.4 10*3/uL (ref 0.7–4.0)
MCH: 28.3 pg (ref 26.0–34.0)
MCHC: 32.7 g/dL (ref 30.0–36.0)
MCV: 86.7 fL (ref 80.0–100.0)
Monocytes Absolute: 0.3 10*3/uL (ref 0.1–1.0)
Monocytes Relative: 7 %
Neutro Abs: 2.9 10*3/uL (ref 1.7–7.7)
Neutrophils Relative %: 61 %
Platelets: 302 10*3/uL (ref 150–400)
RBC: 4.13 MIL/uL (ref 3.87–5.11)
RDW: 13.2 % (ref 11.5–15.5)
WBC: 4.7 10*3/uL (ref 4.0–10.5)
nRBC: 0 % (ref 0.0–0.2)

## 2023-07-10 LAB — URINALYSIS, ROUTINE W REFLEX MICROSCOPIC
Bilirubin Urine: NEGATIVE
Glucose, UA: NEGATIVE mg/dL
Ketones, ur: NEGATIVE mg/dL
Nitrite: NEGATIVE
Protein, ur: 30 mg/dL — AB
Specific Gravity, Urine: 1.025 (ref 1.005–1.030)
pH: 5 (ref 5.0–8.0)

## 2023-07-10 LAB — TYPE AND SCREEN
ABO/RH(D): B POS
Antibody Screen: NEGATIVE

## 2023-07-10 LAB — COMPREHENSIVE METABOLIC PANEL
ALT: 13 U/L (ref 0–44)
AST: 18 U/L (ref 15–41)
Albumin: 4.2 g/dL (ref 3.5–5.0)
Alkaline Phosphatase: 55 U/L (ref 38–126)
Anion gap: 14 (ref 5–15)
BUN: 21 mg/dL (ref 8–23)
CO2: 24 mmol/L (ref 22–32)
Calcium: 10.3 mg/dL (ref 8.9–10.3)
Chloride: 102 mmol/L (ref 98–111)
Creatinine, Ser: 1.09 mg/dL — ABNORMAL HIGH (ref 0.44–1.00)
GFR, Estimated: 55 mL/min — ABNORMAL LOW (ref >60)
Glucose, Bld: 100 mg/dL — ABNORMAL HIGH (ref 70–99)
Potassium: 3.2 mmol/L — ABNORMAL LOW (ref 3.5–5.1)
Sodium: 140 mmol/L (ref 135–145)
Total Bilirubin: 1 mg/dL (ref 0.3–1.2)
Total Protein: 8.6 g/dL — ABNORMAL HIGH (ref 6.5–8.1)

## 2023-07-10 LAB — SURGICAL PCR SCREEN
MRSA, PCR: NEGATIVE
Staphylococcus aureus: POSITIVE — AB

## 2023-07-10 MED ORDER — POTASSIUM CHLORIDE ER 10 MEQ PO TBCR: 10.0000 meq | EXTENDED_RELEASE_TABLET | Freq: Every day | ORAL | 0 refills | Status: AC

## 2023-07-10 NOTE — Patient Instructions (Signed)
Your procedure is scheduled on: Thursday, September 5 Report to the Registration Desk on the 1st floor of the [facility] To find out your arrival time, please call [phone] between 1PM - 3PM on: Wednesday, September 4 If your arrival time is 6:00 am, do not arrive before that time as the [facility] entrance doors do not open until 6:00 am.  REMEMBER: Instructions that are not followed completely may result in serious medical risk, up to and including death; or upon the discretion of your surgeon and anesthesiologist your surgery may need to be rescheduled.  Do not eat food after midnight the night before surgery.  No gum chewing or hard candies.  You may however, drink CLEAR liquids up to 2 hours before you are scheduled to arrive for your surgery. Do not drink anything within 2 hours of your scheduled arrival time.  Clear liquids include: - water  - apple juice without pulp - gatorade (not RED colors) - black coffee or tea (Do NOT add milk or creamers to the coffee or tea) Do NOT drink anything that is not on this list.  In addition, your doctor has ordered for you to drink the provided:  Ensure Pre-Surgery Clear Carbohydrate Drink  Drinking this carbohydrate drink up to two hours before surgery helps to reduce insulin resistance and improve patient outcomes. Please complete drinking 2 hours before scheduled arrival time.  One week prior to surgery: starting August 29 Stop Anti-inflammatories (NSAIDS) such as Advil, Aleve, Ibuprofen, Motrin, Naproxen, Naprosyn and Aspirin based products such as Excedrin, Goody's Powder, BC Powder. Stop ANY OVER THE COUNTER supplements until after surgery. You may however, continue to take Tylenol if needed for pain up until the day of surgery.  Continue taking all prescribed medications   TAKE ONLY THESE MEDICATIONS THE MORNING OF SURGERY WITH A SIP OF WATER:  Gabapentin Ipratropium-albuterol inhaler. Use inhaler on the day of surgery  and bring to the hospital.  No Alcohol for 24 hours before or after surgery.  No Smoking including e-cigarettes for 24 hours before surgery.  No chewable tobacco products for at least 6 hours before surgery.  No nicotine patches on the day of surgery.  Do not use any "recreational" drugs for at least a week (preferably 2 weeks) before your surgery.  Please be advised that the combination of cocaine and anesthesia may have negative outcomes, up to and including death. If you test positive for cocaine, your surgery will be cancelled.  On the morning of surgery brush your teeth with toothpaste and water, you may rinse your mouth with mouthwash if you wish. Do not swallow any toothpaste or mouthwash.  Use CHG Soap as directed on instruction sheet.  Do not wear jewelry, make-up, hairpins, clips or nail polish.  Do not wear lotions, powders, or perfumes.   Do not shave body hair from the neck down 48 hours before surgery.  Contact lenses, hearing aids and dentures may not be worn into surgery.  Do not bring valuables to the hospital. [facility] is not responsible for any missing/lost belongings or valuables.   Notify your doctor if there is any change in your medical condition (cold, fever, infection).  Wear comfortable clothing (specific to your surgery type) to the hospital.  After surgery, you can help prevent lung complications by doing breathing exercises.  Take deep breaths and cough every 1-2 hours. Your doctor may order a device called an Incentive Spirometer to help you take deep breaths.  If you  are being admitted to the hospital overnight, leave your suitcase in the car. After surgery it may be brought to your room.  In case of increased patient census, it may be necessary for you, the patient, to continue your postoperative care in the Same Day Surgery department.  If you are being discharged the day of surgery, you will not be allowed to drive home. You will need a  responsible individual to drive you home and stay with you for 24 hours after surgery.   If you are taking public transportation, you will need to have a responsible individual with you.  Please call the Pre-admissions Testing Dept. at [phone] if you have any questions about these instructions.  Surgery Visitation Policy:  Patients having surgery or a procedure may have two visitors.  Children under the age of 48 must have an adult with them who is not the patient.  Inpatient Visitation:    Visiting hours are 7 a.m. to 8 p.m. Up to four visitors are allowed at one time in a patient room. The visitors may rotate out with other people during the day.  One visitor age 30 or older may stay with the patient overnight and must be in the room by 8 p.m.    Pre-operative 5 CHG Bath Instructions   You can play a key role in reducing the risk of infection after surgery. Your skin needs to be as free of germs as possible. You can reduce the number of germs on your skin by washing with CHG (chlorhexidine gluconate) soap before surgery. CHG is an antiseptic soap that kills germs and continues to kill germs even after washing.   DO NOT use if you have an allergy to chlorhexidine/CHG or antibacterial soaps. If your skin becomes reddened or irritated, stop using the CHG and notify one of our RNs at [phone].   Please shower with the CHG soap starting 4 days before surgery using the following schedule:     Please keep in mind the following:  DO NOT shave, including legs and underarms, starting the day of your first shower.   You may shave your face at any point before/day of surgery.  Place clean sheets on your bed the day you start using CHG soap. Use a clean washcloth (not used since being washed) for each shower. DO NOT sleep with pets once you start using the CHG.   CHG Shower Instructions:  If you choose to wash your hair and private area, wash first with your normal shampoo/soap.   After you use shampoo/soap, rinse your hair and body thoroughly to remove shampoo/soap residue.  Turn the water OFF and apply about 3 tablespoons (45 ml) of CHG soap to a CLEAN washcloth.  Apply CHG soap ONLY FROM YOUR NECK DOWN TO YOUR TOES (washing for 3-5 minutes)  DO NOT use CHG soap on face, private areas, open wounds, or sores.  Pay special attention to the area where your surgery is being performed.  If you are having back surgery, having someone wash your back for you may be helpful. Wait 2 minutes after CHG soap is applied, then you may rinse off the CHG soap.  Pat dry with a clean towel  Put on clean clothes/pajamas   If you choose to wear lotion, please use ONLY the CHG-compatible lotions on the back of this paper.     Additional instructions for the day of surgery: DO NOT APPLY any lotions, deodorants, cologne, or perfumes.   Put on  clean/comfortable clothes.  Brush your teeth.  Ask your nurse before applying any prescription medications to the skin.      CHG Compatible Lotions   Aveeno Moisturizing lotion  Cetaphil Moisturizing Cream  Cetaphil Moisturizing Lotion  Clairol Herbal Essence Moisturizing Lotion, Dry Skin  Clairol Herbal Essence Moisturizing Lotion, Extra Dry Skin  Clairol Herbal Essence Moisturizing Lotion, Normal Skin  Curel Age Defying Therapeutic Moisturizing Lotion with Alpha Hydroxy  Curel Extreme Care Body Lotion  Curel Soothing Hands Moisturizing Hand Lotion  Curel Therapeutic Moisturizing Cream, Fragrance-Free  Curel Therapeutic Moisturizing Lotion, Fragrance-Free  Curel Therapeutic Moisturizing Lotion, Original Formula  Eucerin Daily Replenishing Lotion  Eucerin Dry Skin Therapy Plus Alpha Hydroxy Crme  Eucerin Dry Skin Therapy Plus Alpha Hydroxy Lotion  Eucerin Original Crme  Eucerin Original Lotion  Eucerin Plus Crme Eucerin Plus Lotion  Eucerin TriLipid Replenishing Lotion  Keri Anti-Bacterial Hand Lotion  Keri Deep Conditioning  Original Lotion Dry Skin Formula Softly Scented  Keri Deep Conditioning Original Lotion, Fragrance Free Sensitive Skin Formula  Keri Lotion Fast Absorbing Fragrance Free Sensitive Skin Formula  Keri Lotion Fast Absorbing Softly Scented Dry Skin Formula  Keri Original Lotion  Keri Skin Renewal Lotion Keri Silky Smooth Lotion  Keri Silky Smooth Sensitive Skin Lotion  Nivea Body Creamy Conditioning Oil  Nivea Body Extra Enriched Lotion  Nivea Body Original Lotion  Nivea Body Sheer Moisturizing Lotion Nivea Crme  Nivea Skin Firming Lotion  NutraDerm 30 Skin Lotion  NutraDerm Skin Lotion  NutraDerm Therapeutic Skin Cream  NutraDerm Therapeutic Skin Lotion  ProShield Protective Hand Cream  Provon moisturizing lotion  Preoperative Educational Videos for Total Hip, Knee and Shoulder Replacements  To better prepare for surgery, please view our videos that explain the physical activity and discharge planning required to have the best surgical recovery at [facility].  [email or web address]  Questions? Call [phone] or email [email or web address]

## 2023-07-10 NOTE — Progress Notes (Signed)
[facility] Perioperative Services: Pre-Admission/Anesthesia Testing  Abnormal Lab Notification and Treatment Plan of Care   Date: 07/10/23  Name: Sarah Buck MRN:   [id number]  Re: Abnormal labs noted during PAT appointment   Notified:  Provider Name Provider Role Notification Mode  Poggi, Jonny Ruiz, MD Orthopedics (Surgeon) Routed and/or faxed via Manya Silvas, MD Primary Care Provider Routed and/or faxed via [facility]   Clinical Information and Notes:  ABNORMAL LAB VALUE(S): Lab Results  Component Value Date   K 3.2 (L) 07/10/2023   Sarah Buck is scheduled for an elective RIGHT TOTAL HIP ARTHROPLASTY on 07/18/2023. In review of her medication reconciliation, it is noted that the patient is taking prescribed diuretic medications (HCTZ 25 mg) daily.   Please note, in efforts to promote a safe and effective anesthetic course, per current guidelines/standards set by the [facility] anesthesia team, the minimal acceptable K+ level for the patient to proceed with general anesthesia is 3.0 mmol/L. With that being said, if the patient drops any lower, her elective procedure will need to be postponed until K+ is better optimized. In efforts to prevent case cancellation, will make efforts to optimize pre-surgical K+ level so that patient can safely undergo the planned surgical intervention.   Impression and Plan:  Sarah Buck found to be HYPOkalemic at 3.2 mmol/L on preoperative labs. She is on daily thiazide diuretic therapy. Called patient to discuss results and plans for correction of noted electrolyte derangement as follows:  Meds ordered this encounter  Medications   potassium chloride (KLOR-CON) 10 MEQ tablet    Sig: Take 1 tablet (10 mEq total) by mouth daily. Be sure to take dose on day of surgery.    Dispense:  9 tablet    Refill:  0    Please contact patient when Rx is ready to be picked up. Rx is for preoperative K+ optimization and needs to be started ASAP.    Encouraged patient to follow up with PCP about 2-3 weeks postoperatively to have labs rechecked to ensure that levels are remaining within normal range. Discussed nutritional intake of K+ rich foods as an adjunctive way to keep her K+ levels normal; list of K+ rich foods verbally provided. Also mentioned ORS, however advised her not to rely solely on these drinks, as they are high in Na+, and she has a HTN diagnosis.   Will send copy of this note to surgeon and PCP to make them aware of K+ level and plans for correction. Discussed that PCP may elect to pursue a change in diuretic therapy to a K+ sparing type medication, or alternatively, they may consider adding a daily K+ supplement if levels remain low on recheck. Order entered to recheck K+ on the day of her surgery to ensure optimization. Wished patient the best of luck with her upcoming surgery and subsequent recovery. She was encouraged to return call to the PAT clinic, or to her surgeon's office, should any questions or concerns arise between now and the time of her surgery. Patient was appreciative of the care/concern expressed by PAT staff.   Encounter Diagnoses  Name Primary?   Pre-operative laboratory examination Yes   Diuretic-induced hypokalemia    Long term current use of diuretic    Quentin Mulling, MSN, APRN, FNP-C, CEN [facility]  Peri-operative Services Nurse Practitioner Phone: [phone] 07/10/23 12:07 PM  NOTE: This note has been prepared using Dragon dictation software. Despite my best ability to proofread, there is always the  potential that unintentional transcriptional errors may still occur from this process.

## 2023-07-18 ENCOUNTER — Ambulatory Visit
Admission: RE | Admit: 2023-07-18 | Discharge: 2023-07-18 | Disposition: A | Discharge status: Home / Self Care | Payer: 59 | Source: Ambulatory Visit | Attending: Surgery | Admitting: Surgery

## 2023-07-18 ENCOUNTER — Ambulatory Visit: Payer: 59 | Admitting: Certified Registered"

## 2023-07-18 ENCOUNTER — Encounter
Admission: RE | Disposition: A | Discharge status: Home / Self Care | Payer: Self-pay | Source: Ambulatory Visit | Attending: Surgery

## 2023-07-18 ENCOUNTER — Encounter: Payer: Self-pay | Admitting: Surgery

## 2023-07-18 ENCOUNTER — Ambulatory Visit: Payer: 59 | Admitting: Urgent Care

## 2023-07-18 ENCOUNTER — Other Ambulatory Visit: Payer: Self-pay

## 2023-07-18 ENCOUNTER — Ambulatory Visit: Payer: 59

## 2023-07-18 DIAGNOSIS — M1611 Unilateral primary osteoarthritis, right hip: Secondary | ICD-10-CM | POA: Insufficient documentation

## 2023-07-18 DIAGNOSIS — T502X5A Adverse effect of carbonic-anhydrase inhibitors, benzothiadiazides and other diuretics, initial encounter: Secondary | ICD-10-CM

## 2023-07-18 DIAGNOSIS — J449 Chronic obstructive pulmonary disease, unspecified: Secondary | ICD-10-CM | POA: Diagnosis not present

## 2023-07-18 DIAGNOSIS — Z79899 Other long term (current) drug therapy: Secondary | ICD-10-CM | POA: Insufficient documentation

## 2023-07-18 DIAGNOSIS — I10 Essential (primary) hypertension: Secondary | ICD-10-CM | POA: Insufficient documentation

## 2023-07-18 DIAGNOSIS — Z01812 Encounter for preprocedural laboratory examination: Secondary | ICD-10-CM

## 2023-07-18 HISTORY — PX: TOTAL HIP ARTHROPLASTY: SHX124

## 2023-07-18 LAB — POCT I-STAT, CHEM 8
BUN: 14 mg/dL (ref 8–23)
Calcium, Ion: 1.29 mmol/L (ref 1.15–1.40)
Chloride: 100 mmol/L (ref 98–111)
Creatinine, Ser: 1 mg/dL (ref 0.44–1.00)
Glucose, Bld: 86 mg/dL (ref 70–99)
HCT: 33 % — ABNORMAL LOW (ref 36.0–46.0)
Hemoglobin: 11.2 g/dL — ABNORMAL LOW (ref 12.0–15.0)
Potassium: 3.4 mmol/L — ABNORMAL LOW (ref 3.5–5.1)
Sodium: 138 mmol/L (ref 135–145)
TCO2: 25 mmol/L (ref 22–32)

## 2023-07-18 LAB — ABO/RH: ABO/RH(D): B POS

## 2023-07-18 SURGERY — ARTHROPLASTY, HIP, TOTAL,POSTERIOR APPROACH
Anesthesia: Spinal | Site: Hip | Laterality: Right

## 2023-07-18 MED ORDER — PHENYLEPHRINE HCL-NACL 20-0.9 MG/250ML-% IV SOLN
INTRAVENOUS | Status: DC | PRN
Start: 2023-07-18 — End: 2023-07-18
  Administered 2023-07-18: 10 ug/min via INTRAVENOUS

## 2023-07-18 MED ORDER — LACTATED RINGERS IV SOLN: INTRAVENOUS | Status: DC

## 2023-07-18 MED ORDER — METOCLOPRAMIDE HCL 5 MG/ML IJ SOLN: 5.0000 mg | Freq: Three times a day (TID) | INTRAMUSCULAR | Status: DC | PRN

## 2023-07-18 MED ORDER — CEFAZOLIN SODIUM-DEXTROSE 2-4 GM/100ML-% IV SOLN
INTRAVENOUS | Status: AC
  Filled 2023-07-18: qty 100

## 2023-07-18 MED ORDER — METOCLOPRAMIDE HCL 10 MG PO TABS: 5.0000 mg | ORAL_TABLET | Freq: Three times a day (TID) | ORAL | Status: DC | PRN

## 2023-07-18 MED ORDER — FENTANYL CITRATE (PF) 100 MCG/2ML IJ SOLN
INTRAMUSCULAR | Status: AC
  Filled 2023-07-18: qty 2

## 2023-07-18 MED ORDER — GLYCOPYRROLATE 0.2 MG/ML IJ SOLN
INTRAMUSCULAR | Status: DC | PRN
  Administered 2023-07-18: .2 mg via INTRAVENOUS

## 2023-07-18 MED ORDER — CEFAZOLIN SODIUM-DEXTROSE 2-4 GM/100ML-% IV SOLN
2.0000 g | Freq: Four times a day (QID) | INTRAVENOUS | Status: DC
  Administered 2023-07-18: 2 g via INTRAVENOUS

## 2023-07-18 MED ORDER — FAMOTIDINE 20 MG PO TABS
ORAL_TABLET | ORAL | Status: AC
  Filled 2023-07-18: qty 1

## 2023-07-18 MED ORDER — FENTANYL CITRATE (PF) 100 MCG/2ML IJ SOLN: 25.0000 ug | INTRAMUSCULAR | Status: DC | PRN

## 2023-07-18 MED ORDER — MIDAZOLAM HCL 5 MG/5ML IJ SOLN
INTRAMUSCULAR | Status: DC | PRN
  Administered 2023-07-18: 2 mg via INTRAVENOUS

## 2023-07-18 MED ORDER — SODIUM CHLORIDE FLUSH 0.9 % IV SOLN
INTRAVENOUS | Status: AC
  Filled 2023-07-18: qty 40

## 2023-07-18 MED ORDER — EPHEDRINE SULFATE (PRESSORS) 50 MG/ML IJ SOLN
INTRAMUSCULAR | Status: DC | PRN
Start: 2023-07-18 — End: 2023-07-18
  Administered 2023-07-18 (×3): 5 mg via INTRAVENOUS
  Administered 2023-07-18 (×3): 10 mg via INTRAVENOUS

## 2023-07-18 MED ORDER — MIDAZOLAM HCL 2 MG/2ML IJ SOLN
INTRAMUSCULAR | Status: AC
  Filled 2023-07-18: qty 2

## 2023-07-18 MED ORDER — BUPIVACAINE HCL (PF) 0.5 % IJ SOLN
INTRAMUSCULAR | Status: AC
  Filled 2023-07-18: qty 30

## 2023-07-18 MED ORDER — 0.9 % SODIUM CHLORIDE (POUR BTL) OPTIME
TOPICAL | Status: DC | PRN
  Administered 2023-07-18: 500 mL

## 2023-07-18 MED ORDER — ONDANSETRON HCL 4 MG/2ML IJ SOLN
INTRAMUSCULAR | Status: DC | PRN
  Administered 2023-07-18: 4 mg via INTRAVENOUS

## 2023-07-18 MED ORDER — CHLORHEXIDINE GLUCONATE 0.12 % MT SOLN
15.0000 mL | Freq: Once | OROMUCOSAL | Status: AC
  Administered 2023-07-18: 15 mL via OROMUCOSAL

## 2023-07-18 MED ORDER — SODIUM CHLORIDE 0.9 % IR SOLN
Status: DC | PRN
  Administered 2023-07-18: 3000 mL

## 2023-07-18 MED ORDER — SODIUM CHLORIDE 0.9 % IV SOLN: INTRAVENOUS | Status: DC

## 2023-07-18 MED ORDER — FAMOTIDINE 20 MG PO TABS
20.0000 mg | ORAL_TABLET | Freq: Once | ORAL | Status: AC
  Administered 2023-07-18: 20 mg via ORAL

## 2023-07-18 MED ORDER — CHLORHEXIDINE GLUCONATE 0.12 % MT SOLN
OROMUCOSAL | Status: AC
  Filled 2023-07-18: qty 15

## 2023-07-18 MED ORDER — OXYCODONE HCL 5 MG PO TABS
5.0000 mg | ORAL_TABLET | ORAL | 0 refills | Status: AC | PRN
Start: 2023-07-18 — End: ?

## 2023-07-18 MED ORDER — KETOROLAC TROMETHAMINE 15 MG/ML IJ SOLN
15.0000 mg | Freq: Once | INTRAMUSCULAR | Status: AC
  Administered 2023-07-18: 15 mg via INTRAVENOUS

## 2023-07-18 MED ORDER — PHENYLEPHRINE HCL-NACL 20-0.9 MG/250ML-% IV SOLN
INTRAVENOUS | Status: AC
  Filled 2023-07-18: qty 250

## 2023-07-18 MED ORDER — OXYCODONE HCL 5 MG PO TABS: 5.0000 mg | ORAL_TABLET | ORAL | Status: DC | PRN

## 2023-07-18 MED ORDER — ORAL CARE MOUTH RINSE: 15.0000 mL | Freq: Once | OROMUCOSAL | Status: AC

## 2023-07-18 MED ORDER — DEXAMETHASONE SODIUM PHOSPHATE 10 MG/ML IJ SOLN
INTRAMUSCULAR | Status: DC | PRN
  Administered 2023-07-18: 10 mg via INTRAVENOUS

## 2023-07-18 MED ORDER — OXYCODONE HCL 5 MG PO TABS: 5.0000 mg | ORAL_TABLET | Freq: Once | ORAL | Status: DC | PRN

## 2023-07-18 MED ORDER — CEFAZOLIN SODIUM-DEXTROSE 2-4 GM/100ML-% IV SOLN
2.0000 g | INTRAVENOUS | Status: AC
  Administered 2023-07-18: 2 g via INTRAVENOUS

## 2023-07-18 MED ORDER — BUPIVACAINE-EPINEPHRINE (PF) 0.5% -1:200000 IJ SOLN
INTRAMUSCULAR | Status: AC
  Filled 2023-07-18: qty 30

## 2023-07-18 MED ORDER — ACETAMINOPHEN 10 MG/ML IV SOLN
INTRAVENOUS | Status: AC
  Filled 2023-07-18: qty 100

## 2023-07-18 MED ORDER — BUPIVACAINE-MELOXICAM ER 400-12 MG/14ML IJ SOLN
INTRAMUSCULAR | Status: AC
  Filled 2023-07-18: qty 1

## 2023-07-18 MED ORDER — FENTANYL CITRATE (PF) 100 MCG/2ML IJ SOLN
INTRAMUSCULAR | Status: DC | PRN
  Administered 2023-07-18: 25 ug via INTRAVENOUS

## 2023-07-18 MED ORDER — OXYCODONE HCL 5 MG/5ML PO SOLN: 5.0000 mg | Freq: Once | ORAL | Status: DC | PRN

## 2023-07-18 MED ORDER — PROPOFOL 1000 MG/100ML IV EMUL
INTRAVENOUS | Status: AC
  Filled 2023-07-18: qty 100

## 2023-07-18 MED ORDER — PROPOFOL 500 MG/50ML IV EMUL
INTRAVENOUS | Status: DC | PRN
  Administered 2023-07-18: 100 ug/kg/min via INTRAVENOUS

## 2023-07-18 MED ORDER — PROPOFOL 10 MG/ML IV BOLUS
INTRAVENOUS | Status: AC
  Filled 2023-07-18: qty 20

## 2023-07-18 MED ORDER — SODIUM CHLORIDE 0.9 % BOLUS PEDS
250.0000 mL | Freq: Once | INTRAVENOUS | Status: AC
  Administered 2023-07-18: 250 mL via INTRAVENOUS

## 2023-07-18 MED ORDER — ACETAMINOPHEN 325 MG PO TABS: 325.0000 mg | ORAL_TABLET | Freq: Four times a day (QID) | ORAL | Status: DC | PRN

## 2023-07-18 MED ORDER — TRIAMCINOLONE ACETONIDE 40 MG/ML IJ SUSP
INTRAMUSCULAR | Status: AC
  Filled 2023-07-18: qty 2

## 2023-07-18 MED ORDER — TRIAMCINOLONE ACETONIDE 40 MG/ML IJ SUSP
INTRAMUSCULAR | Status: DC | PRN
  Administered 2023-07-18: 92.5 mL

## 2023-07-18 MED ORDER — ONDANSETRON HCL 4 MG/2ML IJ SOLN: 4.0000 mg | Freq: Four times a day (QID) | INTRAMUSCULAR | Status: DC | PRN

## 2023-07-18 MED ORDER — APIXABAN 2.5 MG PO TABS: 2.5000 mg | ORAL_TABLET | Freq: Two times a day (BID) | ORAL | 0 refills | Status: AC

## 2023-07-18 MED ORDER — KETOROLAC TROMETHAMINE 15 MG/ML IJ SOLN
INTRAMUSCULAR | Status: AC
  Filled 2023-07-18: qty 1

## 2023-07-18 MED ORDER — ACETAMINOPHEN 10 MG/ML IV SOLN
INTRAVENOUS | Status: DC | PRN
  Administered 2023-07-18: 1000 mg via INTRAVENOUS

## 2023-07-18 MED ORDER — BUPIVACAINE HCL (PF) 0.5 % IJ SOLN
INTRAMUSCULAR | Status: DC | PRN
  Administered 2023-07-18: 3 mL

## 2023-07-18 MED ORDER — ONDANSETRON HCL 4 MG PO TABS: 4.0000 mg | ORAL_TABLET | Freq: Four times a day (QID) | ORAL | Status: DC | PRN

## 2023-07-18 SURGICAL SUPPLY — 56 items
APL PRP STRL LF DISP 70% ISPRP (MISCELLANEOUS) ×1
BLADE SAGITTAL WIDE XTHICK NO (BLADE) ×1 IMPLANT
BLADE SURG SZ20 CARB STEEL (BLADE) ×1 IMPLANT
CHLORAPREP W/TINT 26 (MISCELLANEOUS) ×1 IMPLANT
DRAPE IMP U-DRAPE 54X76 (DRAPES) IMPLANT
DRAPE INCISE IOBAN 66X60 STRL (DRAPES) ×1 IMPLANT
DRAPE SHEET LG 3/4 BI-LAMINATE (DRAPES) ×1 IMPLANT
DRAPE SURG 17X11 SM STRL (DRAPES) ×2 IMPLANT
DRSG MEPILEX SACRM 8.7X9.8 (GAUZE/BANDAGES/DRESSINGS) ×1 IMPLANT
DRSG OPSITE POSTOP 4X10 (GAUZE/BANDAGES/DRESSINGS) ×1 IMPLANT
ELECT CAUTERY BLADE 6.4 (BLADE) ×1 IMPLANT
GAUZE 4X4 16PLY ~~LOC~~+RFID DBL (SPONGE) ×1 IMPLANT
GAUZE XEROFORM 1X8 LF (GAUZE/BANDAGES/DRESSINGS) ×1 IMPLANT
GLOVE BIO SURGEON STRL SZ7.5 (GLOVE) ×4 IMPLANT
GLOVE BIO SURGEON STRL SZ8 (GLOVE) ×4 IMPLANT
GLOVE BIOGEL PI IND STRL 8 (GLOVE) ×1 IMPLANT
GLOVE INDICATOR 8.0 STRL GRN (GLOVE) ×1 IMPLANT
GOWN STRL REUS W/ TWL LRG LVL3 (GOWN DISPOSABLE) ×1 IMPLANT
GOWN STRL REUS W/ TWL XL LVL3 (GOWN DISPOSABLE) ×1 IMPLANT
GOWN STRL REUS W/TWL LRG LVL3 (GOWN DISPOSABLE) ×1
GOWN STRL REUS W/TWL XL LVL3 (GOWN DISPOSABLE) ×3
HANDLE YANKAUER SUCT OPEN TIP (MISCELLANEOUS) ×1 IMPLANT
HEAD CERAMIC BIOLOX 36MM (Head) IMPLANT
HIP SHELL ACETAB 3H 50MM (Hips) ×1 IMPLANT
HIP SLEEVE BIOLOX -6MM OFFSET (Sleeve) ×1 IMPLANT
HOLSTER ELECTROSUGICAL PENCIL (MISCELLANEOUS) ×1 IMPLANT
HOOD PEEL AWAY T7 (MISCELLANEOUS) ×3 IMPLANT
IV NS IRRIG 3000ML ARTHROMATIC (IV SOLUTION) ×1 IMPLANT
KIT TURNOVER KIT A (KITS) ×1 IMPLANT
LINER ACE G7 36 SZ D HIGH WALL (Liner) IMPLANT
MANIFOLD NEPTUNE II (INSTRUMENTS) ×1 IMPLANT
NDL FILTER BLUNT 18X1 1/2 (NEEDLE) ×1 IMPLANT
NDL SAFETY ECLIP 18X1.5 (MISCELLANEOUS) ×2 IMPLANT
NDL SPNL 20GX3.5 QUINCKE YW (NEEDLE) ×1 IMPLANT
NEEDLE FILTER BLUNT 18X1 1/2 (NEEDLE) IMPLANT
NEEDLE SPNL 20GX3.5 QUINCKE YW (NEEDLE) ×1 IMPLANT
PACK HIP PROSTHESIS (MISCELLANEOUS) ×1 IMPLANT
PENCIL SMOKE EVACUATOR (MISCELLANEOUS) ×1 IMPLANT
PIN STEINMANN 3/16X9 BAY 6PK (Pin) ×2 IMPLANT
PULSAVAC PLUS IRRIG FAN TIP (DISPOSABLE) ×1
SHELL ACETAB HIP 3H 50MM (Hips) IMPLANT
SLEEVE HIP BIOLOX -6MM OFFSET (Sleeve) IMPLANT
SPONGE T-LAP 18X18 ~~LOC~~+RFID (SPONGE) ×5 IMPLANT
ST PIN 3/16X9 BAY 6PK (Pin) ×2 IMPLANT
STAPLER SKIN PROX 35W (STAPLE) ×1 IMPLANT
STEM COLLARLESS FULL 9X125 (Stem) IMPLANT
SUT TICRON 2-0 30IN 311381 (SUTURE) ×3 IMPLANT
SUT VIC AB 0 CT1 36 (SUTURE) ×1 IMPLANT
SUT VIC AB 1 CT1 36 (SUTURE) ×1 IMPLANT
SUT VIC AB 2-0 CT1 (SUTURE) ×3 IMPLANT
SYR 10ML LL (SYRINGE) ×1 IMPLANT
SYR 20ML LL LF (SYRINGE) ×1 IMPLANT
SYR 30ML LL (SYRINGE) ×2 IMPLANT
TIP FAN IRRIG PULSAVAC PLUS (DISPOSABLE) ×1 IMPLANT
TRAP FLUID SMOKE EVACUATOR (MISCELLANEOUS) ×2 IMPLANT
WATER STERILE IRR 1000ML POUR (IV SOLUTION) ×1 IMPLANT

## 2023-07-18 NOTE — Transfer of Care (Signed)
Immediate Anesthesia Transfer of Care Note  Patient: Gerre Couch  Procedure(s) Performed: TOTAL HIP ARTHROPLASTY (Right: Hip)  Patient Location: PACU  Anesthesia Type:General  Level of Consciousness: awake and alert   Airway & Oxygen Therapy: Patient Spontanous Breathing  Post-op Assessment: Report given to RN and Post -op Vital signs reviewed and stable  Post vital signs: stable  Last Vitals:  Vitals Value Taken Time  BP 94/56 07/18/23 0952  Temp    Pulse 87 07/18/23 0953  Resp 15 07/18/23 0953  SpO2 97 % 07/18/23 0953  Vitals shown include unfiled device data.  Last Pain:  Vitals:   07/18/23 0627  TempSrc: Oral  PainSc: 8       Patients Stated Pain Goal: 0 (07/18/23 0981)  Complications: No notable events documented.

## 2023-07-18 NOTE — Anesthesia Preprocedure Evaluation (Signed)
Anesthesia Evaluation  Patient identified by MRN, date of birth, ID band Patient awake    Reviewed: Allergy & Precautions, NPO status , Patient's Chart, lab work & pertinent test results  Airway Mallampati: III  TM Distance: >3 FB Neck ROM: full    Dental  (+) Chipped, Dental Advidsory Given   Pulmonary COPD,  COPD inhaler   Pulmonary exam normal        Cardiovascular Exercise Tolerance: Good hypertension, On Medications negative cardio ROS Normal cardiovascular exam     Neuro/Psych    GI/Hepatic negative GI ROS,,,  Endo/Other    Renal/GU   negative genitourinary   Musculoskeletal   Abdominal   Peds  Hematology negative hematology ROS (+)   Anesthesia Other Findings Past Medical History: No date: Arthritis No date: Asthma No date: Cataract cortical, senile No date: COPD (chronic obstructive pulmonary disease) (HCC) No date: DDD (degenerative disc disease), lumbar No date: Eczema No date: Gout No date: Hyperlipidemia No date: Hypertension, essential No date: Peripheral artery disease (HCC) No date: Pneumonia  Past Surgical History: 1960: EYE SURGERY; Left     Comment:  puncture from wood chip  BMI    Body Mass Index: 26.79 kg/m      Reproductive/Obstetrics (+) Pregnancy                             Anesthesia Physical Anesthesia Plan  ASA: 3  Anesthesia Plan: Spinal   Post-op Pain Management:    Induction:   PONV Risk Score and Plan: 2 and Ondansetron, Propofol infusion, TIVA and Midazolam  Airway Management Planned: Natural Airway and Nasal Cannula  Additional Equipment:   Intra-op Plan:   Post-operative Plan:   Informed Consent: I have reviewed the patients History and Physical, chart, labs and discussed the procedure including the risks, benefits and alternatives for the proposed anesthesia with the patient or authorized representative who has indicated  his/her understanding and acceptance.     Dental Advisory Given  Plan Discussed with: Anesthesiologist, CRNA and Surgeon  Anesthesia Plan Comments: (Patient reports no bleeding problems and no anticoagulant use.  Plan for spinal with backup GA  Patient consented for risks of anesthesia including but not limited to:  - adverse reactions to medications - damage to eyes, teeth, lips or other oral mucosa - nerve damage due to positioning  - risk of bleeding, infection and or nerve damage from spinal that could lead to paralysis - risk of headache or failed spinal - damage to teeth, lips or other oral mucosa - sore throat or hoarseness - damage to heart, brain, nerves, lungs, other parts of body or loss of life  Patient voiced understanding.)       Anesthesia Quick Evaluation

## 2023-07-18 NOTE — Evaluation (Addendum)
Physical Therapy Evaluation Patient Details Name: Sarah Buck MRN: [id number] DOB: February 19, 1951 Today's Date: 07/18/2023  History of Present Illness  Patient is a 72 y.o. who is s/p R THA, posterior approach 03/20/41 . PMH includes: asthma, COPD, and HTN.   Clinical Impression  Pt was pleasant and motivated to participate during the session and put forth good effort throughout. Pt found supine in bed. Provided and educated pt on precaution/HEP packet at start of session with pt verbalizing understanding.  Pt is currently Mod I with bed mobility, only needing extra time to complete. She is CGA for STS transfers with RW from slightly elevated bed. Provided cues for RLE management and safe sequencing with good carryover. Pt initially hesitant with ambulation, experiencing a slight R knee buckle within the first few steps but was able to self correct with no physical assist needed. Pt ultimately able to walk 200 feet with RW and CGA overall, quickly progressing with greater confidence, showing improved gait quality overall. Finished session with education on car transfer sequencing. Pt will benefit from continued PT services upon discharge to safely address deficits listed in patient problem list for decreased caregiver assistance and eventual return to PLOF.          If plan is discharge home, recommend the following: A little help with walking and/or transfers;A little help with bathing/dressing/bathroom;Assist for transportation;Assistance with cooking/housework   Can travel by private Tax inspector (2 wheels);BSC/3in1  Recommendations for Other Services       Functional Status Assessment Patient has had a recent decline in their functional status and demonstrates the ability to make significant improvements in function in a reasonable and predictable amount of time.     Precautions / Restrictions Precautions Precautions: Posterior Hip Precaution  Booklet Issued: Yes (comment) Precaution Comments: Percaution/HEP pamphlet issued Restrictions Weight Bearing Restrictions: Yes RLE Weight Bearing: Weight bearing as tolerated      Mobility  Bed Mobility Overal bed mobility: Modified Independent Bed Mobility: Supine to Sit     Supine to sit: Modified independent (Device/Increase time)     General bed mobility comments: increased time    Transfers Overall transfer level: Needs assistance Equipment used: Rolling walker (2 wheels) Transfers: Sit to/from Stand Sit to Stand: Contact guard assist           General transfer comment: cues for hand placement and percautions    Ambulation/Gait Ambulation/Gait assistance: Contact guard assist Gait Distance (Feet): 200 Feet Assistive device: Rolling walker (2 wheels) Gait Pattern/deviations: Decreased step length - right, Decreased step length - left, Step-through pattern Gait velocity: decreased   Pre-gait activities: standing weight shifts and marching General Gait Details: initial instruction for step-to pattern used. noted initial imbalance and hesitation for RLE weight bearing, noting pts knee buckled slightly within initial few steps with pt able to correct without physical assist, but quickly progressed to greater stride length with a step through pattern and improved gait speed. Provided cues for steady strides and gait speed to tolerance.  Stairs            Wheelchair Mobility     Tilt Bed    Modified Rankin (Stroke Patients Only)       Balance Overall balance assessment: Needs assistance Sitting-balance support: Feet supported, No upper extremity supported Sitting balance-Leahy Scale: Good     Standing balance support: Reliant on assistive device for balance, During functional activity, Bilateral upper extremity supported Standing balance-Leahy Scale: Fair  Standing balance comment: static standing at RW                              Pertinent Vitals/Pain Pain Assessment Pain Assessment: No/denies pain    Home Living Family/patient expects to be discharged to:: Private residence Living Arrangements: Parent;Other relatives;Children Available Help at Discharge: Family;Available 24 hours/day Type of Home: House Home Access: Level entry       Home Layout: One level Home Equipment: Cane - single point;Cane - quad (several canes) Additional Comments: pt reports having RW that is her mothers, mentioned it being somewhat old.    Prior Function Prior Level of Function : Independent/Modified Independent             Mobility Comments: Ind, walking with no AD, community distances ADLs Comments: Ind, driving     Extremity/Trunk Assessment   Upper Extremity Assessment Upper Extremity Assessment: Overall WFL for tasks assessed    Lower Extremity Assessment Lower Extremity Assessment: Expected post-op weakness; pt able to perform SLR       Communication   Communication Communication: No apparent difficulties  Cognition Arousal: Alert Behavior During Therapy: WFL for tasks assessed/performed Overall Cognitive Status: Within Functional Limits for tasks assessed                                          General Comments      Exercises Total Joint Exercises Ankle Circles/Pumps: Both, 10 reps, AROM, Strengthening Quad Sets: Both, 10 reps, AROM, Strengthening Gluteal Sets: Both, 10 reps, AROM, Strengthening Short Arc Quad: Right, 10 reps, AROM, Strengthening Heel Slides: Right, 10 reps, AROM, Strengthening Hip ABduction/ADduction: Right, 10 reps, AROM, Strengthening Straight Leg Raises: Right, 5 reps, AROM, Strengthening Marching in Standing: Both, 10 reps, AROM, Strengthening Other Exercises Other Exercises: Pt educated on percaution/HEP pamphlet at beginning of session, pt verbalized understanding and was able to maintain percautions throughout session activities Other Exercises:  Educated pt on appropriate and safe car transfer sequencing with pt verbalizing understanding   Assessment/Plan    PT Assessment Patient needs continued PT services  PT Problem List Decreased strength;Decreased range of motion;Decreased activity tolerance;Decreased balance;Decreased mobility;Decreased coordination       PT Treatment Interventions DME instruction;Gait training;Stair training;Functional mobility training;Therapeutic activities;Therapeutic exercise;Balance training    PT Goals (Current goals can be found in the Care Plan section)  Acute Rehab PT Goals Patient Stated Goal: walk without pain PT Goal Formulation: With patient Time For Goal Achievement: 07/31/23 Potential to Achieve Goals: Good    Frequency BID     Co-evaluation               AM-PAC PT "6 Clicks" Mobility  Outcome Measure Help needed turning from your back to your side while in a flat bed without using bedrails?: A Little Help needed moving from lying on your back to sitting on the side of a flat bed without using bedrails?: A Little Help needed moving to and from a bed to a chair (including a wheelchair)?: A Little Help needed standing up from a chair using your arms (e.g., wheelchair or bedside chair)?: A Little Help needed to walk in hospital room?: A Little Help needed climbing 3-5 steps with a railing? : A Little 6 Click Score: 18    End of Session Equipment Utilized During Treatment: Gait belt Activity Tolerance:  Patient tolerated treatment well Patient left: Other (comment) (in bathroom on BSC, nursing notified of pt position) Nurse Communication: Mobility status (pt position status on BSC) PT Visit Diagnosis: Other abnormalities of gait and mobility (R26.89);Difficulty in walking, not elsewhere classified (R26.2);Unsteadiness on feet (R26.81)    Time: 0454-0981 PT Time Calculation (min) (ACUTE ONLY): 41 min   Charges:                 Cecile Sheerer, SPT 07/18/23, 4:07  PM This entire session was performed under direct supervision and direction of a licensed therapist/therapist assistant. I have personally read, edited and approve of the note as written.  Loran Senters, DPT

## 2023-07-18 NOTE — Op Note (Signed)
07/18/2023  9:49 AM  Patient:   Sarah Buck  Pre-Op Diagnosis:   Degenerative joint disease, right hip.  Post-Op Diagnosis:   Same.  Procedure:   Right total hip arthroplasty.  Surgeon:   Maryagnes Amos, MD  Assistant:   Horris Latino, PA-C; Jacqulyn Liner, PA-S  Anesthesia:   Spinal  Findings:   As above.  Complications:   None  EBL:   100 cc  Fluids:   1000 cc crystalloid  UOP:   None cc  TT:   None  Drains:   None  Closure:   Staples  Implants:   Biomet press-fit system with a #9 laterally offset Echo femoral stem, a 50 mm acetabular shell with an E-poly hi-wall liner, and a 36 mm ceramic head with a -6 mm neck.  Brief Clinical Note:   The patient is a 72 year old female with a long history of gradually worsening right hip pain. Her symptoms have progressed despite medications, activity modification, etc. Her history and examination are consistent with advanced degenerative joint disease of the right hip, confirmed by plain radiographs. The patient presents at this time for a right total hip arthroplasty.   Procedure:   The patient was brought into the operating room. After adequate spinal anesthesia was obtained, the patient was repositioned in the left lateral decubitus position and secured using a lateral hip positioner. The right hip and lower extremity were prepped with ChloroPrep solution before being draped sterilely. Preoperative antibiotics were administered. A timeout was performed to verify the appropriate surgical site.    A standard posterior approach to the hip was made through an approximately 4-5 inch incision. The incision was carried down through the subcutaneous tissues to expose the gluteal fascia and proximal end of the iliotibial band. These structures were split the length of the incision and the Charnley self-retaining hip retractor placed. The bursal tissues were swept posteriorly to expose the short external rotators. The anterior border of the  piriformis tendon was identified and this plane developed down through the capsule to enter the joint. A flap of tissue was elevated off the posterior aspect of the femoral neck and greater trochanter and retracted posteriorly. This flap included the piriformis tendon, the short external rotators, and the posterior capsule. The soft tissues were elevated off the lateral aspect of the ilium and a large Steinmann pin placed bicortically.   With the right leg aligned over the left, a drill bit was placed into the greater trochanter parallel to the Steinmann pin and the distance between these two pins measured in order to optimize leg lengths postoperatively. The drill bit was removed and the hip dislocated. The piriformis fossa was debrided of soft tissues before the intramedullary canal was accessed through this point using a triple step reamer. The canal was reamed sequentially beginning with a #7 tapered reamer and progressing to a #9 tapered reamer. This provided excellent circumferential chatter. Using the appropriate guide, a femoral neck cut was made 10-12 mm above the lesser trochanter. The femoral head was removed.  Attention was directed to the acetabular side. The labrum was debrided circumferentially before the ligamentum teres was removed using a large curette. A line was drawn on the drapes corresponding to the native version of the acetabulum. This line was used as a guide while the acetabulum was reamed sequentially beginning with a 45 mm reamer and progressing to a 49 mm reamer. This provided excellent circumferential chatter. The 49 mm trial acetabulum was positioned and found to  fit quite well. Therefore, the 50 mm acetabular shell was selected and impacted into place with care taken to maintain the appropriate version. The trial high wall liner was inserted.  Attention was redirected to the femoral side. A box osteotome was used to establish version before the canal was broached sequentially  beginning with a #7 broach and progressing to a #9 broach. This was left in place and several trial reductions performed using both the standard and laterally offset neck options, as well as the -6 mm neck length. After removing the trial components, the "manhole cover" was placed into the apex of the acetabular shell and tightened securely. The permanent E-polyethylene hi-wall liner was impacted into the acetabular shell and its locking mechanism verified using a quarter-inch osteotome. Next, the #9 laterally offset femoral stem was impacted into place with care taken to maintain the appropriate version. A repeat trial reduction was performed using the -6 mm neck length. The -6 mm neck length demonstrated excellent stability both in extension and external rotation as well as with flexion to 90 and internal rotation beyond 70. It also was stable in the position of sleep. In addition, leg lengths appeared to be restored appropriately, both by reassessing the position of the right leg over the left, as well as by measuring the distance between the Steinmann pin and the drill bit. The 36 mm ceramic head with the -6 mm neck adapter was impacted onto the stem of the femoral component. The Morse taper locking mechanism was verified using manual distraction before the head was relocated and placed through a range of motion with the findings as described above.  The wound was copiously irrigated with sterile saline solution via the jet lavage system before the peri-incisional and pericapsular tissues were injected with a "cocktail" of 20 cc of Exparel, 30 cc of 0.5% Sensorcaine, 2 cc of Kenalog 40 (80 mg), and 30 mg of Toradol diluted out to 90 cc with normal saline to help with postoperative analgesia. The posterior flap was reapproximated to the posterior aspect of the greater trochanter using #2 Tycron interrupted sutures placed through drill holes. Several additional #2 Tycron interrupted sutures were used to  reinforce this layer of closure. The iliotibial band was reapproximated using #1 Vicryl interrupted sutures before the gluteal fascia was closed using a running #1 Vicryl suture. At this point, 1 g of transexemic acid in 10 cc of normal saline was injected into the joint to help reduce postoperative bleeding. The subcutaneous tissues were closed in several layers using 2-0 Vicryl interrupted sutures before the skin was closed using staples. A sterile occlusive dressing was applied to the wound. The patient was then rolled back into the supine position on her hospital bed before being awakened and returned to the recovery room in satisfactory condition after tolerating the procedure well.

## 2023-07-18 NOTE — Progress Notes (Signed)
Patient is not able to walk the distance required to go the bathroom, or he/she is unable to safely negotiate stairs required to access the bathroom.  A 3in1 BSC will alleviate this problem  

## 2023-07-18 NOTE — TOC Progression Note (Signed)
Transition of Care [facility]) - CM/SW Discharge Note   Patient Details  Name: Sarah Buck MRN: [id number] Date of Birth: 09-Oct-1951  Transition of Care [facility]) CM/SW Contact:  Marlowe Sax, RN Phone Number: 07/18/2023, 12:36 PM   Clinical Narrative:    [facility] was set up prior to surgery by surgeons office prior to surgery for [facility] services RW and 3 in 1 to be delivered to the bedside by [employer]   Final next level of care: Home w Home Health Services Barriers to Discharge: No Barriers Identified   Patient Goals and CMS Choice      Discharge Placement                         Discharge Plan and Services Additional resources added to the After Visit Summary for     Discharge Planning Services: CM Consult            DME Arranged: Dan Humphreys rolling, 3-N-1 DME Agency: [facility] Date DME Agency Contacted: 07/18/23 Time DME Agency Contacted: 1236 Representative spoke with at DME Agency: Osvaldo Angst Arranged: PT HH Agency: [facility] Date [facility] Agency Contacted: 07/18/23 Time HH Agency Contacted: 1236 Representative spoke with at [facility] Agency: [place]  Social Determinants of Health (SDOH) Interventions SDOH Screenings   Tobacco Use: High Risk (07/18/2023)     Readmission Risk Interventions     No data to display

## 2023-07-18 NOTE — H&P (Signed)
History of Present Illness: Sarah Buck is a 72 y.o. who presents today for a history and physical. She is to undergo a right total hip arthroplasty on 07/18/2023. Since her last visit here to clinic there have been no improvement in her condition. The patient expresses her desire to proceed with surgery.  The patient has been experiencing right hip pain over the past several years. She was evaluated by [facility] clinic physiatry who did perform right intra-articular hip steroid injections routinely for the patient however she recently underwent a right intra-articular hip injection in May of this year which did not provide any significant relief of her right hip and leg pain. Previously she had experienced moderate to significant relief after injections. The patient reports a aching and throbbing discomfort along the anterior aspect of the right thigh with radiation into the knee and into the anterior groin. The patient reports an 8 out of 10 pain score at today's visit. She has undergone right hip x-rays which demonstrated significant right hip osteoarthritic changes. The patient denies any worsening numbness or ting of the right lower extremity. She denies any surgical history. She is taking both gabapentin and occasional hydrocodone for discomfort. The patient denies any personal history of heart attack or stroke. She does have a history of COPD. She denies any history of blood clots or diabetes. The patient is quite frustrated with her ongoing right leg and hip pain and would like to discuss more aggressive treatment options at this time.   Past Medical History: Allergic state  Asthma without status asthmaticus (HHS-HCC)  Cataract cortical, senile  COPD (chronic obstructive pulmonary disease) (CMS/HHS-HCC)  Eczema, unspecified  Gout  Hypertension   Past Surgical History: No pertinent surgical history.  Past Family History: High blood pressure (Hypertension) Mother  Breast cancer Mother   Cancer Father  Diabetes type II Father  Allergies Father  Asthma Father  Myocardial Infarction (Heart attack) Father  Osteoporosis (Thinning of bones) Other  Rheum arthritis Other   Medications: gabapentin (NEURONTIN) 300 MG capsule TAKE 1 CAPSULE BY MOUTH EVERY MORNING, 1CAPSULE AT LUNCH AND 2 CAPSULES AT BEDTIME 120 capsule 11  gatifloxacin (ZYMAXID) 0.5 % ophthalmic solution Place 1 drop into both eyes 3 (three) times daily  hydroCHLOROthiazide (HYDRODIURIL) 25 MG tablet TAKE 1 TABLET BY MOUTH DAILY 90 tablet 3  HYDROcodone-acetaminophen (NORCO) 5-325 mg tablet TAKE 1 TABLET TWICE A DAY AS NEEDED FOR PAIN. 14 tablet 0  ipratropium-albuteroL (COMBIVENT RESPIMAT) 20-100 mcg/actuation inhaler Inhale 1 inhalation into the lungs 4 (four) times daily as needed for Wheezing 4 g 3  ketoconazole (NIZORAL) 2 % cream Apply topically once daily for 6 weeks.  ketorolac (ACULAR) 0.5 % ophthalmic solution Place 1 drop into both eyes 4 (four) times daily  montelukast (SINGULAIR) 10 mg tablet Take 1 tablet (10 mg total) by mouth at bedtime 30 tablet 0  prednisoLONE acetate (PRED FORTE) 1 % ophthalmic suspension Place 1 drop into both eyes 2 (two) times daily   Allergies: No Known Allergies   Review of Systems: A comprehensive 14 point ROS was performed, reviewed, and the pertinent orthopaedic findings are documented in the HPI.  Physical Exam: BP 120/88 (BP Location: Left upper arm, Patient Position: Sitting, BP Cuff Size: Adult)  Ht 165.1 cm (5\' 5" )  Wt 72.8 kg (160 lb 6.4 oz)  BMI 26.69 kg/m   General: Well-developed well-nourished female seen in no acute distress.   HEENT: Atraumatic,normocephalic. Pupils are equal and reactive to light. Oropharynx is clear with  moist mucosa  Lungs: Clear to auscultation bilaterally   Cardiovascular: Regular rate and rhythm. Normal S1, S2. No murmurs. No appreciable gallops or rubs. Peripheral pulses are palpable.  Abdomen: Soft, non-tender,  nondistended. Bowel sounds present  RIght Lower Extremity: Examination of the right lower extremities reveals no bony abnormality, no edema, and no ecchymosis. The patient has limited range of motion to the right hip. With right hip flexion the patient does have increased pain when attempting to flex the hip 95 degrees. Limited extension. With the right hip flexed 90 degrees the patient is able to internally rotate 25 degrees, external rotation close to 45 degrees. Positive logroll maneuver to the right leg. The patient has moderate tenderness going from hip flexion into extension. The patient has a negative Denna Haggard' test bilaterally. There is normal skin warmth. There is normal capillary refill bilaterally. Patient is noted to ambulate with a significant antalgic gait using a cane for assistance  Neurological: The patient is alert and oriented Sensation to light touch appears to be intact and within normal limits Gross motor strength appeared to be equal to 5/5  Vascular : Peripheral pulses felt to be palpable. Capillary refill appears to be intact and within normal limits  X-ray: X-rays taken in [facility] demonstrate significant right hip osteoarthritic changes with loss of joint space, subchondral cyst formation in addition to osteophyte formation and sclerotic changes. There does not appear to be any evidence of a dislocation or acute fracture. These x-rays were reviewed today and discussed with the patient in detail.   Impression: Degenerative arthrosis right hip  Plan:  The treatment options, including both surgical and nonsurgical choices, have been discussed in detail with the patient and her family.  She would like to proceed with surgical intervention to include a right total hip arthroplasty.  The risks (including bleeding, infection, nerve and/or blood vessel injury, persistent or recurrent pain, loosening or failure of the components, leg length inequality, dislocation, need  for further surgery, blood clots, strokes, heart attacks or arrhythmias, pneumonia, etc.) and benefits of the surgical procedure were discussed.  The patient states his/her understanding and agrees to proceed.  A formal written consent will be obtained by the nursing staff.   H&P reviewed and patient re-examined. No changes.

## 2023-07-18 NOTE — Anesthesia Procedure Notes (Signed)
Spinal  Patient location during procedure: OR Start time: 07/18/2023 7:32 AM End time: 07/18/2023 7:40 AM Reason for block: surgical anesthesia Staffing Performed: resident/CRNA  Anesthesiologist: Stephanie Coup, MD Resident/CRNA: Maryla Morrow., CRNA Performed by: Maryla Morrow., CRNA Authorized by: Stephanie Coup, MD   Preanesthetic Checklist Completed: patient identified, IV checked, site marked, risks and benefits discussed, surgical consent, monitors and equipment checked, pre-op evaluation and timeout performed Spinal Block Patient position: sitting Prep: ChloraPrep Patient monitoring: heart rate, continuous pulse ox, blood pressure and cardiac monitor Approach: midline Location: L4-5 Injection technique: single-shot Needle Needle type: Whitacre and Introducer  Needle gauge: 24 G Needle length: 9 cm Assessment Events: CSF return Additional Notes Negative paresthesia. Negative blood return. Positive free-flowing CSF. Expiration date of kit checked and confirmed. Patient tolerated procedure well, without complications.

## 2023-07-18 NOTE — Discharge Instructions (Addendum)
Orthopedic discharge instructions: May shower with intact OpSite dressing. Apply ice frequently to knee or use Polar Care. Start Eliquis 1 tablet (2.5 mg) twice daily on Friday, 07/19/2023, for 2 weeks, then take aspirin 325 mg (or 4 baby aspirin) twice daily for 4 weeks. Take pain medication as prescribed when needed.  May supplement with ES Tylenol if necessary. May weight-bear as tolerated on right leg - use walker for balance and support. Follow-up in 10-14 days or as scheduled.   AMBULATORY SURGERY  DISCHARGE INSTRUCTIONS   The drugs that you were given will stay in your system until tomorrow so for the next 24 hours you should not:  Drive an automobile Make any legal decisions Drink any alcoholic beverage   You may resume regular meals tomorrow.  Today it is better to start with liquids and gradually work up to solid foods.  You may eat anything you prefer, but it is better to start with liquids, then soup and crackers, and gradually work up to solid foods.   Please notify your doctor immediately if you have any unusual bleeding, trouble breathing, redness and pain at the surgery site, drainage, fever, or pain not relieved by medication.    Additional Instructions:        Please contact your physician with any problems or Same Day Surgery at [phone], Monday through Friday 6 am to 4 pm, or [place] at [facility] number at [phone].

## 2023-07-19 DIAGNOSIS — Z96641 Presence of right artificial hip joint: Secondary | ICD-10-CM | POA: Insufficient documentation

## 2023-07-19 DIAGNOSIS — M1611 Unilateral primary osteoarthritis, right hip: Secondary | ICD-10-CM | POA: Insufficient documentation

## 2023-07-19 NOTE — Anesthesia Postprocedure Evaluation (Signed)
Anesthesia Post Note  Patient: Sarah Buck  Procedure(s) Performed: TOTAL HIP ARTHROPLASTY (Right: Hip)  Patient location during evaluation: Nursing Unit Anesthesia Type: Spinal Level of consciousness: awake and alert Pain management: pain level controlled Vital Signs Assessment: post-procedure vital signs reviewed and stable Respiratory status: spontaneous breathing, nonlabored ventilation, respiratory function stable and patient connected to nasal cannula oxygen Cardiovascular status: blood pressure returned to baseline and stable Postop Assessment: no apparent nausea or vomiting Anesthetic complications: no  There were no known notable events for this encounter.   Last Vitals:  Vitals:   07/18/23 1127 07/18/23 1506  BP: 119/68 138/74  Pulse: (!) 54 70  Resp: 16 15  Temp: (!) 36.2 C 36.4 C  SpO2: 98% 99%    Last Pain:  Vitals:   07/18/23 1506  TempSrc: Temporal  PainSc: 0-No pain                 Stephanie Coup

## 2023-07-22 NOTE — Group Note (Deleted)

## 2023-07-29 ENCOUNTER — Encounter: Payer: Self-pay | Admitting: Surgery

## 2023-10-03 ENCOUNTER — Ambulatory Visit (INDEPENDENT_AMBULATORY_CARE_PROVIDER_SITE_OTHER): Payer: 59 | Admitting: Podiatry

## 2023-10-03 ENCOUNTER — Encounter: Payer: Self-pay | Admitting: Podiatry

## 2023-10-03 DIAGNOSIS — I739 Peripheral vascular disease, unspecified: Secondary | ICD-10-CM | POA: Diagnosis not present

## 2023-10-03 DIAGNOSIS — M79676 Pain in unspecified toe(s): Secondary | ICD-10-CM | POA: Diagnosis not present

## 2023-10-03 DIAGNOSIS — Q828 Other specified congenital malformations of skin: Secondary | ICD-10-CM

## 2023-10-03 DIAGNOSIS — B351 Tinea unguium: Secondary | ICD-10-CM | POA: Diagnosis not present

## 2023-10-12 NOTE — Progress Notes (Signed)
  Subjective:  Patient ID: Sarah Buck, female    DOB: Jun 09, 1951,  MRN: [id number]  Elyana Caulley presents to clinic today for at risk foot care. Patient has h/o PAD and painful porokeratotic lesion(s) both feet and painful mycotic toenails that limit ambulation. Painful toenails interfere with ambulation. Aggravating factors include wearing enclosed shoe gear. Pain is relieved with periodic professional debridement. Painful porokeratotic lesions are aggravated when weightbearing with and without shoegear. Pain is relieved with periodic professional debridement.  New problem(s): None.   PCP is Jerl Mina, MD.  No Known Allergies  Review of Systems: Negative except as noted in the HPI.  Objective:  There were no vitals filed for this visit. Cristian Slavey is a pleasant 72 y.o. female WD, WN in NAD. AAO x 3.  Vascular Examination: CFT <3 seconds b/l. DP pulses faintly palpable b/l. PT pulses nonpalpable b/l. Digital hair absent. Skin temperature gradient warm to warm b/l. No pain with calf compression. No ischemia or gangrene. No cyanosis or clubbing noted b/l.    Neurological Examination: Sensation grossly intact b/l with 10 gram monofilament. Vibratory sensation intact b/l.   Dermatological Examination: Pedal skin warm and supple b/l. No open wounds b/l. No interdigital macerations. Toenails 1-5 b/l thick, discolored, elongated with subungual debris and pain on dorsal palpation.    Hyperkeratotic lesion(s) submet head 2 b/l and submet head 5 b/l.  No erythema, no edema, no drainage, no fluctuance  Musculoskeletal Examination: Muscle strength 5/5 to all lower extremity muscle groups bilaterally. Plantarflexed metatarsal(s) 2nd metatarsal head of both feet and 5th metatarsal head of both feet. Patient ambulates independent of any assistive aids.  Radiographs: None Assessment/Plan: 1. Pain due to onychomycosis of toenail   2. Porokeratosis   3. PAD (peripheral artery disease)  (HCC)    -Patient was evaluated today. All questions/concerns addressed on today's visit. -Continue supportive shoe gear daily. -Mycotic toenails 1-5 bilaterally were debrided in length and girth with sterile nail nippers and dremel without incident. -Porokeratotic lesion(s) submet head 2 b/l and submet head 5 b/l pared and enucleated with sterile currette without incident. Total number of lesions debrided=4. -Patient/POA to call should there be question/concern in the interim.   Return in about 3 months (around 01/03/2024).  Freddie Breech, DPM      [facility] LOCATION: [address] [place] [address]                   Office [phone]   [facility] LOCATION: [address] [place], [place] [address] Office [phone]

## 2024-01-06 ENCOUNTER — Ambulatory Visit (INDEPENDENT_AMBULATORY_CARE_PROVIDER_SITE_OTHER): Payer: 59 | Admitting: Podiatry

## 2024-01-06 ENCOUNTER — Encounter: Payer: Self-pay | Admitting: Podiatry

## 2024-01-06 VITALS — Ht 65.0 in | Wt 161.0 lb

## 2024-01-06 DIAGNOSIS — Q828 Other specified congenital malformations of skin: Secondary | ICD-10-CM

## 2024-01-06 DIAGNOSIS — M79676 Pain in unspecified toe(s): Secondary | ICD-10-CM

## 2024-01-06 DIAGNOSIS — I739 Peripheral vascular disease, unspecified: Secondary | ICD-10-CM

## 2024-01-06 DIAGNOSIS — B351 Tinea unguium: Secondary | ICD-10-CM

## 2024-01-06 NOTE — Progress Notes (Unsigned)
  Subjective:  Patient ID: Sarah Buck, female    DOB: Aug 02, 1951,  MRN: [id number]  Cassandr Buck presents to clinic today for {jgcomplaint:23593}  Chief Complaint  Patient presents with   Nail Problem    Pt is here for [facility] PCP is Dr Burnett Sheng and LOV in July.   New problem(s): None.   PCP is Jerl Mina, MD.  No Known Allergies  Review of Systems: Negative except as noted in the HPI.  Objective: No changes noted in today's physical examination. There were no vitals filed for this visit.  Sarah Buck is a pleasant 73 y.o. female {jgbodyhabitus:24098} AAO x 3. Vascular Examination: CFT <3 seconds b/l. DP pulses faintly palpable b/l. PT pulses nonpalpable b/l. Digital hair absent. Skin temperature gradient warm to warm b/l. No pain with calf compression. No ischemia or gangrene. No cyanosis or clubbing noted b/l.    Neurological Examination: Sensation grossly intact b/l with 10 gram monofilament. Vibratory sensation intact b/l.   Dermatological Examination: Pedal skin warm and supple b/l. No open wounds b/l. No interdigital macerations. Toenails 1-5 b/l thick, discolored, elongated with subungual debris and pain on dorsal palpation.    Hyperkeratotic lesion(s) submet head 2 b/l and submet head 5 b/l.  No erythema, no edema, no drainage, no fluctuance  Musculoskeletal Examination: Muscle strength 5/5 to all lower extremity muscle groups bilaterally. Plantarflexed metatarsal(s) 2nd metatarsal head of both feet and 5th metatarsal head of both feet. Patient ambulates independent of any assistive aids.  Radiographs: None  Assessment/Plan: 1. Pain due to onychomycosis of toenail   2. Porokeratosis   3. PAD (peripheral artery disease) (HCC)     No orders of the defined types were placed in this encounter.   None {Jgplan:23602::"-Patient/POA to call should there be question/concern in the interim."}   Return in about 3 months (around 04/04/2024).  Freddie Breech, DPM       [facility] LOCATION: [address] [place] [address]                   Office [phone]   [facility] LOCATION: [address] [place], [place] [address] Office [phone]

## 2024-01-12 ENCOUNTER — Emergency Department
Admission: EM | Admit: 2024-01-12 | Discharge: 2024-01-12 | Disposition: A | Discharge status: Home / Self Care | Attending: Emergency Medicine | Admitting: Emergency Medicine

## 2024-01-12 DIAGNOSIS — Y92410 Unspecified street and highway as the place of occurrence of the external cause: Secondary | ICD-10-CM | POA: Diagnosis not present

## 2024-01-12 DIAGNOSIS — Z043 Encounter for examination and observation following other accident: Secondary | ICD-10-CM | POA: Diagnosis present

## 2024-01-12 MED ORDER — ACETAMINOPHEN 325 MG PO TABS
650.0000 mg | ORAL_TABLET | Freq: Once | ORAL | Status: AC
  Administered 2024-01-12: 650 mg via ORAL
  Filled 2024-01-12: qty 2
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
## 2024-04-13 ENCOUNTER — Ambulatory Visit (INDEPENDENT_AMBULATORY_CARE_PROVIDER_SITE_OTHER): Payer: 59 | Admitting: Podiatry

## 2024-04-13 DIAGNOSIS — Z91198 Patient's noncompliance with other medical treatment and regimen for other reason: Secondary | ICD-10-CM

## 2024-04-14 NOTE — Progress Notes (Signed)
 1. Failure to attend appointment with reason given    Appointment canceled and rescheduled by patient.

## 2024-05-04 ENCOUNTER — Ambulatory Visit (INDEPENDENT_AMBULATORY_CARE_PROVIDER_SITE_OTHER): Admitting: Podiatry

## 2024-05-04 DIAGNOSIS — Z91199 Patient's noncompliance with other medical treatment and regimen due to unspecified reason: Secondary | ICD-10-CM

## 2024-05-05 NOTE — Progress Notes (Signed)
 1. No-show for appointment

## 2024-12-31 ENCOUNTER — Ambulatory Visit: Admitting: Podiatry
